# Patient Record
Sex: Female | Born: 1980
Health system: Southern US, Community
[De-identification: ages and names within clinical notes are randomized; demographics above are authoritative.]

## PROBLEM LIST (undated history)

## (undated) DIAGNOSIS — T783XXA Angioneurotic edema, initial encounter: Secondary | ICD-10-CM

## (undated) DIAGNOSIS — G43909 Migraine, unspecified, not intractable, without status migrainosus: Secondary | ICD-10-CM

## (undated) DIAGNOSIS — F419 Anxiety disorder, unspecified: Secondary | ICD-10-CM

## (undated) DIAGNOSIS — L509 Urticaria, unspecified: Secondary | ICD-10-CM

## (undated) DIAGNOSIS — T7840XA Allergy, unspecified, initial encounter: Secondary | ICD-10-CM

## (undated) DIAGNOSIS — L309 Dermatitis, unspecified: Secondary | ICD-10-CM

## (undated) DIAGNOSIS — R42 Dizziness and giddiness: Secondary | ICD-10-CM

## (undated) HISTORY — DX: Anxiety disorder, unspecified: F41.9

## (undated) HISTORY — PX: OTHER SURGICAL HISTORY: SHX169

## (undated) HISTORY — DX: Angioneurotic edema, initial encounter: T78.3XXA

## (undated) HISTORY — DX: Dermatitis, unspecified: L30.9

## (undated) HISTORY — DX: Allergy, unspecified, initial encounter: T78.40XA

## (undated) HISTORY — DX: Dizziness and giddiness: R42

## (undated) HISTORY — DX: Migraine, unspecified, not intractable, without status migrainosus: G43.909

## (undated) HISTORY — DX: Urticaria, unspecified: L50.9

---

## 2020-01-25 DIAGNOSIS — Z0289 Encounter for other administrative examinations: Secondary | ICD-10-CM | POA: Diagnosis not present

## 2020-10-16 ENCOUNTER — Other Ambulatory Visit: Payer: Self-pay

## 2020-10-17 ENCOUNTER — Ambulatory Visit (INDEPENDENT_AMBULATORY_CARE_PROVIDER_SITE_OTHER): Payer: BC Managed Care – PPO | Admitting: Medical

## 2020-10-17 ENCOUNTER — Encounter: Payer: Self-pay | Admitting: Medical

## 2020-10-17 VITALS — BP 132/82 | HR 96 | Temp 98.3°F | Resp 18 | Ht 64.0 in | Wt 110.0 lb

## 2020-10-17 DIAGNOSIS — R5383 Other fatigue: Secondary | ICD-10-CM

## 2020-10-17 DIAGNOSIS — F419 Anxiety disorder, unspecified: Secondary | ICD-10-CM | POA: Diagnosis not present

## 2020-10-17 DIAGNOSIS — Z124 Encounter for screening for malignant neoplasm of cervix: Secondary | ICD-10-CM

## 2020-10-17 DIAGNOSIS — T7840XA Allergy, unspecified, initial encounter: Secondary | ICD-10-CM

## 2020-10-17 DIAGNOSIS — K642 Third degree hemorrhoids: Secondary | ICD-10-CM

## 2020-10-17 DIAGNOSIS — Z0000 Encounter for general adult medical examination without abnormal findings: Secondary | ICD-10-CM | POA: Diagnosis not present

## 2020-10-17 DIAGNOSIS — Z113 Encounter for screening for infections with a predominantly sexual mode of transmission: Secondary | ICD-10-CM

## 2020-10-17 DIAGNOSIS — R768 Other specified abnormal immunological findings in serum: Secondary | ICD-10-CM

## 2020-10-17 DIAGNOSIS — M2550 Pain in unspecified joint: Secondary | ICD-10-CM

## 2020-10-17 LAB — CBC WITH DIFFERENTIAL/PLATELET
Basophils Absolute: 0 10*3/uL (ref 0.0–0.1)
Basophils Relative: 0.6 % (ref 0.0–3.0)
Eosinophils Absolute: 0 10*3/uL (ref 0.0–0.7)
Eosinophils Relative: 0.8 % (ref 0.0–5.0)
HCT: 38.7 % (ref 36.0–46.0)
Hemoglobin: 13.4 g/dL (ref 12.0–15.0)
Lymphocytes Relative: 23.5 % (ref 12.0–46.0)
Lymphs Abs: 1.4 10*3/uL (ref 0.7–4.0)
MCHC: 34.6 g/dL (ref 30.0–36.0)
MCV: 87.5 fl (ref 78.0–100.0)
Monocytes Absolute: 0.4 10*3/uL (ref 0.1–1.0)
Monocytes Relative: 7.4 % (ref 3.0–12.0)
Neutro Abs: 4 10*3/uL (ref 1.4–7.7)
Neutrophils Relative %: 67.7 % (ref 43.0–77.0)
Platelets: 223 10*3/uL (ref 150.0–400.0)
RBC: 4.42 Mil/uL (ref 3.87–5.11)
RDW: 12.7 % (ref 11.5–15.5)
WBC: 5.9 10*3/uL (ref 4.0–10.5)

## 2020-10-17 LAB — COMPREHENSIVE METABOLIC PANEL
ALT: 15 U/L (ref 0–35)
AST: 18 U/L (ref 0–37)
Albumin: 4.7 g/dL (ref 3.5–5.2)
Alkaline Phosphatase: 52 U/L (ref 39–117)
BUN: 12 mg/dL (ref 6–23)
CO2: 26 mEq/L (ref 19–32)
Calcium: 9.6 mg/dL (ref 8.4–10.5)
Chloride: 103 mEq/L (ref 96–112)
Creatinine, Ser: 0.63 mg/dL (ref 0.40–1.20)
GFR: 113.26 mL/min (ref >60.00)
Glucose, Bld: 86 mg/dL (ref 70–99)
Potassium: 3.9 mEq/L (ref 3.5–5.1)
Sodium: 137 mEq/L (ref 135–145)
Total Bilirubin: 0.5 mg/dL (ref 0.2–1.2)
Total Protein: 7.4 g/dL (ref 6.0–8.3)

## 2020-10-17 LAB — LIPID PANEL
Cholesterol: 178 mg/dL (ref 0–200)
HDL: 55.4 mg/dL (ref >39.00)
LDL Cholesterol: 110 mg/dL — ABNORMAL HIGH (ref 0–99)
NonHDL: 123.08
Total CHOL/HDL Ratio: 3
Triglycerides: 65 mg/dL (ref 0.0–149.0)
VLDL: 13 mg/dL (ref 0.0–40.0)

## 2020-10-17 LAB — SEDIMENTATION RATE: Sed Rate: 10 mm/hr (ref 0–20)

## 2020-10-17 LAB — TSH: TSH: 1.51 u[IU]/mL (ref 0.35–4.50)

## 2020-10-17 LAB — C-REACTIVE PROTEIN: CRP: <1 mg/dL (ref 0.5–20.0)

## 2020-10-17 MED ORDER — ALPRAZOLAM ER 0.5 MG PO TB24: ORAL_TABLET | ORAL | 0 refills | Status: DC

## 2020-10-17 NOTE — Patient Instructions (Addendum)
For you wellness exam today I have ordered cbc, cmp, hiv and lipid panel.  Vaccine up to date.  Recommend exercise and healthy diet.  We will let you know lab results as they come in.  Follow up date appointment will be determined after lab review.   For anxiety when flying rx xanax limited number.  For hx of allergic reaction refer to allergist. Use benadryl as you have if reoccurs if severe reaction then ED evaluation.  Refer to gyn for pap.  Refer to gi md for hemorrhoids.  For elevated ana and joint pain inflammatory labs.  For history of abnromal thyroid studies and mild  fatigue  tsh and t4.    Preventive Care 68-38 Years Old, Female Preventive care refers to lifestyle choices and visits with your health care provider that can promote health and wellness. This includes:  A yearly physical exam. This is also called an annual wellness visit.  Regular dental and eye exams.  Immunizations.  Screening for certain conditions.  Healthy lifestyle choices, such as: ? Eating a healthy diet. ? Getting regular exercise. ? Not using drugs or products that contain nicotine and tobacco. ? Limiting alcohol use. What can I expect for my preventive care visit? Physical exam Your health care provider may check your:  Height and weight. These may be used to calculate your BMI (body mass index). BMI is a measurement that tells if you are at a healthy weight.  Heart rate and blood pressure.  Body temperature.  Skin for abnormal spots. Counseling Your health care provider may ask you questions about your:  Past medical problems.  Family's medical history.  Alcohol, tobacco, and drug use.  Emotional well-being.  Home life and relationship well-being.  Sexual activity.  Diet, exercise, and sleep habits.  Work and work Statistician.  Access to firearms.  Method of birth control.  Menstrual cycle.  Pregnancy history. What immunizations do I need? Vaccines are  usually given at various ages, according to a schedule. Your health care provider will recommend vaccines for you based on your age, medical history, and lifestyle or other factors, such as travel or where you work.   What tests do I need? Blood tests  Lipid and cholesterol levels. These may be checked every 5 years starting at age 30.  Hepatitis C test.  Hepatitis B test. Screening  Diabetes screening. This is done by checking your blood sugar (glucose) after you have not eaten for a while (fasting).  STD (sexually transmitted disease) testing, if you are at risk.  BRCA-related cancer screening. This may be done if you have a family history of breast, ovarian, tubal, or peritoneal cancers.  Pelvic exam and Pap test. This may be done every 3 years starting at age 61. Starting at age 26, this may be done every 5 years if you have a Pap test in combination with an HPV test. Talk with your health care provider about your test results, treatment options, and if necessary, the need for more tests.   Follow these instructions at home: Eating and drinking  Eat a healthy diet that includes fresh fruits and vegetables, whole grains, lean protein, and low-fat dairy products.  Take vitamin and mineral supplements as recommended by your health care provider.  Do not drink alcohol if: ? Your health care provider tells you not to drink. ? You are pregnant, may be pregnant, or are planning to become pregnant.  If you drink alcohol: ? Limit how much you have to  0-1 drink a day. ? Be aware of how much alcohol is in your drink. In the [place] one drink equals one 12 oz bottle of beer (355 mL), one 5 oz glass of wine (148 mL), or one 1 oz glass of hard liquor (44 mL).   Lifestyle  Take daily care of your teeth and gums. Brush your teeth every morning and night with fluoride toothpaste. Floss one time each day.  Stay active. Exercise for at least 30 minutes 5 or more days each week.  Do not use any  products that contain nicotine or tobacco, such as cigarettes, e-cigarettes, and chewing tobacco. If you need help quitting, ask your health care provider.  Do not use drugs.  If you are sexually active, practice safe sex. Use a condom or other form of protection to prevent STIs (sexually transmitted infections).  If you do not wish to become pregnant, use a form of birth control. If you plan to become pregnant, see your health care provider for a prepregnancy visit.  Find healthy ways to cope with stress, such as: ? Meditation, yoga, or listening to music. ? Journaling. ? Talking to a trusted person. ? Spending time with friends and family. Safety  Always wear your seat belt while driving or riding in a vehicle.  Do not drive: ? If you have been drinking alcohol. Do not ride with someone who has been drinking. ? When you are tired or distracted. ? While texting.  Wear a helmet and other protective equipment during sports activities.  If you have firearms in your house, make sure you follow all gun safety procedures.  Seek help if you have been physically or sexually abused. What's next?  Go to your health care provider once a year for an annual wellness visit.  Ask your health care provider how often you should have your eyes and teeth checked.  Stay up to date on all vaccines. This information is not intended to replace advice given to you by your health care provider. Make sure you discuss any questions you have with your health care provider. Document Revised: 01/16/2020 Document Reviewed: 01/29/2018 Elsevier Patient Education  2021 [employer].

## 2020-10-17 NOTE — Progress Notes (Signed)
Subjective:    Patient ID: Kelly Houston, female    DOB: [birth year], 40 y.o.   MRN: [id number]  HPI  Pt in for first time.  Pt grew up in [place] and [place]. Pt works as Art gallery manager at [employer]. Pt exercises 3-4 times a week(walks, runs and weights). Pt states eats healthy. Non smoker. Social sporadic alcohol use.  Pt last pap about 3 years. Was normal.    Pt had j and j vaccine then phizer booster.  No chronic med use.  Pt does have some anxiety when she travels by air. Upcoming trip to [place] and then will fly to [place].  Mild seasonal allergies in spring. Random rash on body that occurs once a year with lip swelling. No obvious wheezing or shortness of breath. No use of ace inhibitors. Has occurred yearly since youth/teenager. When she gets this will use benadryl and will calm down quickly. No pattern/cause noted on review.   Hx of elevated ANA in past. Some joint pains.       Review of Systems  Constitutional: Positive for fatigue. Negative for chills and fever.  Respiratory: Negative for chest tightness, shortness of breath and wheezing.   Cardiovascular: Negative for chest pain and palpitations.  Gastrointestinal: Negative for abdominal pain.  Musculoskeletal: Positive for arthralgias.       Pain in hands with repetitive movments.  Hematological: Negative for adenopathy. Does not bruise/bleed easily.  Psychiatric/Behavioral: Negative for behavioral problems and confusion.    Past Medical History:  Diagnosis Date  . Allergy   . Anxiety    when she travels by air.     Social History   Socioeconomic History  . Marital status: Single    Spouse name: Not on file  . Number of children: Not on file  . Years of education: Not on file  . Highest education level: Not on file  Occupational History  . Not on file  Tobacco Use  . Smoking status: Not on file  . Smokeless tobacco: Not on file  Substance and Sexual Activity  . Alcohol use: Not on  file  . Drug use: Not on file  . Sexual activity: Not on file  Other Topics Concern  . Not on file  Social History Narrative  . Not on file   Social Determinants of Health   Financial Resource Strain: Not on file  Food Insecurity: Not on file  Transportation Needs: Not on file  Physical Activity: Not on file  Stress: Not on file  Social Connections: Not on file  Intimate Partner Violence: Not on file     No family history on file.  Not on File  Current Outpatient Medications on File Prior to Visit  Medication Sig Dispense Refill  . fluticasone (FLONASE) 50 MCG/ACT nasal spray Place into both nostrils daily.    . naproxen sodium (ALEVE) 220 MG tablet Take 220 mg by mouth.     No current facility-administered medications on file prior to visit.    BP 132/82   Pulse 96   Temp 98.3 F (36.8 C)   Resp 18   Ht 5\' 4"  (1.626 m)   Wt 110 lb (49.9 kg)   LMP 09/29/2020   SpO2 100%   BMI 18.88 kg/m      Objective:   Physical Exam  General Mental Status- Alert. General Appearance- Not in acute distress.   Skin General: Color- Normal Color. Moisture- Normal Moisture.  Neck Carotid Arteries- Normal color. Moisture- Normal Moisture. No  carotid bruits. No JVD.  Chest and Lung Exam Auscultation: Breath Sounds:-Normal.  Cardiovascular Auscultation:Rythm- Regular. Murmurs & Other Heart Sounds:Auscultation of the heart reveals- No Murmurs.  Abdomen Inspection:-Inspeection Normal. Palpation/Percussion:Note:No mass. Palpation and Percussion of the abdomen reveal- Non Tender, Non Distended + BS, no rebound or guarding.    Neurologic Cranial Nerve exam:- CN III-XII intact(No nystagmus), symmetric smile. Strength:- 5/5 equal and symmetric strength both upper and lower extremities.      Assessment & Plan:  For you wellness exam today I have ordered cbc, cmp, hiv and lipid panel.  Vaccine up to date.  Recommend exercise and healthy diet.  We will let you know  lab results as they come in.  Follow up date appointment will be determined after lab review.   For anxiety when flying rx xanax limited number.  For hx of allergic reaction refer to allergist. Use benadryl as you have if reoccurs if severe reaction then ED evaluation.  Refer to gyn for pap.  Refer to gi md for hemorrhoids.  For elevated ana and joint pain inflammatory labs.  For history of abnromal thyroid studies and mild  fatigue  tsh and t4.

## 2020-10-19 ENCOUNTER — Telehealth: Payer: Self-pay | Admitting: Medical

## 2020-10-19 DIAGNOSIS — R768 Other specified abnormal immunological findings in serum: Secondary | ICD-10-CM

## 2020-10-19 DIAGNOSIS — M2550 Pain in unspecified joint: Secondary | ICD-10-CM

## 2020-10-19 LAB — RHEUMATOID FACTOR: Rheumatoid fact SerPl-aCnc: <14 IU/mL (ref <14)

## 2020-10-19 LAB — HIV ANTIBODY (ROUTINE TESTING W REFLEX): HIV 1&2 Ab, 4th Generation: NONREACTIVE

## 2020-10-19 LAB — ANA: Anti Nuclear Antibody (ANA): POSITIVE — AB

## 2020-10-19 LAB — ANTI-NUCLEAR AB-TITER (ANA TITER): ANA Titer 1: 1:80 {titer} — ABNORMAL HIGH

## 2020-10-19 NOTE — Telephone Encounter (Signed)
  Referral to rheumatologist placed. 

## 2020-11-03 ENCOUNTER — Encounter: Payer: Self-pay | Admitting: Certified Nurse Midwife

## 2020-11-03 ENCOUNTER — Other Ambulatory Visit: Payer: Self-pay

## 2020-11-03 ENCOUNTER — Ambulatory Visit (INDEPENDENT_AMBULATORY_CARE_PROVIDER_SITE_OTHER): Payer: BC Managed Care – PPO | Admitting: Certified Nurse Midwife

## 2020-11-03 ENCOUNTER — Other Ambulatory Visit (HOSPITAL_COMMUNITY)
Admission: RE | Admit: 2020-11-03 | Discharge: 2020-11-03 | Disposition: A | Discharge status: Home / Self Care | Payer: BC Managed Care – PPO | Source: Ambulatory Visit | Attending: Certified Nurse Midwife | Admitting: Certified Nurse Midwife

## 2020-11-03 VITALS — BP 122/68 | HR 72 | Resp 16 | Ht 64.0 in | Wt 112.0 lb

## 2020-11-03 DIAGNOSIS — Z01419 Encounter for gynecological examination (general) (routine) without abnormal findings: Secondary | ICD-10-CM | POA: Diagnosis not present

## 2020-11-03 NOTE — Progress Notes (Signed)
Gynecology Annual Exam   History of Present Illness: Kelly Houston is a 40 y.o. single virginal female presenting for an annual exam. She has no complaints today. She is not sexually active. She does perform self breast exams. There is no notable family history of breast or ovarian cancer in her family.   Past Medical History:  Past Medical History:  Diagnosis Date  . Allergy   . Anxiety    when she travels by air.  . Migraines    since age 26    Past Surgical History:  Past Surgical History:  Procedure Laterality Date  . rt wrist cyst removal. ganglion.      Gynecologic History:  LMP: Patient's last menstrual period was 10/25/2020. Average Interval: regular 23-35 days since Covid vaccine Heavy Menses: no Clots: yes Intermenstrual Bleeding: no Postcoital Bleeding: not applicable Dysmenorrhea: minimal Contraception: n/a Last Pap: completed ~3 yrs ago and normal; no hx abnormal paps  Mammogram: reports hx of diag mammo for cysts  Obstetric History: [id number]  Family History:  Family History  Problem Relation Age of Onset  . High Cholesterol Father   . Hypertension Mother     Social History:  Social History   Socioeconomic History  . Marital status: Single    Spouse name: Not on file  . Number of children: Not on file  . Years of education: Not on file  . Highest education level: Not on file  Occupational History  . Not on file  Tobacco Use  . Smoking status: Never Smoker  . Smokeless tobacco: Never Used  Vaping Use  . Vaping Use: Never used  Substance and Sexual Activity  . Alcohol use: Yes    Comment: socially  . Drug use: Never  . Sexual activity: Never    Birth control/protection: None  Other Topics Concern  . Not on file  Social History Narrative  . Not on file   Social Determinants of Health   Financial Resource Strain: Not on file  Food Insecurity: Not on file  Transportation Needs: Not on file  Physical Activity: Not on file   Stress: Not on file  Social Connections: Not on file  Intimate Partner Violence: Not on file    Allergies:  Allergies  Allergen Reactions  . Propranolol Hives and Rash    Medications: Prior to Admission medications   Medication Sig Start Date End Date Taking? Authorizing Provider  ALPRAZolam (XANAX XR) 0.5 MG 24 hr tablet 1 tab po q day prn anxiety 10/17/20  Yes Saguier, Ramon Dredge, PA-C  fluticasone [facility]) 50 MCG/ACT nasal spray Place into both nostrils daily.   Yes [provider]  naproxen sodium (ALEVE) 220 MG tablet Take 220 mg by mouth.   Yes [provider]    Review of Systems: negative except noted in HPI  Physical Exam Vitals: BP 122/68   Pulse 72   Resp 16   Ht 5\' 4"  (1.626 m)   Wt 112 lb (50.8 kg)   LMP 10/25/2020   BMI 19.22 kg/m  General: NAD HEENT: normocephalic, atraumatic Thyroid: no enlargement, no palpable nodules Pulmonary: Normal rate and effort, CTAB Cardiovascular: RRR Breast: Breast symmetrical, no tenderness, no palpable nodules or masses, no skin or nipple retraction present, no nipple discharge. No axillary or supraclavicular lymphadenopathy. Abdomen: soft, non-tender, non-distended. No hepatomegaly, splenomegaly or masses palpable. No evidence of hernia  Genitourinary:  External: Normal external female genitalia. Normal urethral meatus  Vagina: Normal vaginal mucosa, no evidence of prolapse   Cervix:  Grossly normal in appearance, no bleeding, nulliparous  Uterus: deferred  Adnexa: deferred  Rectal: deferred Extremities: no edema, erythema, or tenderness Neurologic: Grossly intact Psychiatric: mood appropriate, affect full  Female chaperone present for pelvic and breast portions of the physical exam  Assessment:  1. Well woman exam with routine gynecological exam     Plan: Papsmear today Follow up with GYN in 1 year or prn Follow up with PCP as planned  Donette Larry, CNM 11/03/2020 9:59 AM

## 2020-11-06 LAB — CYTOLOGY - PAP
Comment: NEGATIVE
Diagnosis: NEGATIVE
High risk HPV: NEGATIVE

## 2020-11-12 NOTE — Progress Notes (Signed)
Office Visit Note  Patient: Kelly Houston             Date of Birth: April 27, 1981           MRN: [id number]             PCP: Elise Benne Referring: Elise Benne Visit Date: 11/13/2020  Subjective:  New Patient (Initial Visit) (Patient complains of fatigue, upper extremity numbness (left>right), bilateral hand pain and stiffness. )   History of Present Illness: Kelly Houston is a 40 y.o. female here for evaluation of positive ANA and joint pains. She has experienced some degree of symptoms for years. More recently she describes increases in numbness episodically affecting the left worse than right arms and in the left foot and great toe. She notices hand pain and stiffness increased with cold exposure. Sleeping on either side often provokes waking overnight or early morning with numbness down the left arm. She has some pain and tightness in the low back limiting hip range of movement. She has had some previous autoimmune workup apparently, she reports what sounds like positive autoantibody tests in the past. She also reports a recent tick bite over the past weekend. She was hiking outdoors on Saturday and noticed a tick attached to her left flank Sunday. She did shower after outdoors exposure Saturday but does not recall any insect at that time. Currently small erythematous rash present without any associated pain or sensation.  Labs reviewed 10/2020 ANA 1:80 speckled TSH 1.51 RF neg ESR 10 CRP neg CBC wnl CMP wnl   Activities of Daily Living:  Patient reports morning stiffness for 15-20 minutes.   Patient Reports nocturnal pain.  Difficulty dressing/grooming: Denies Difficulty climbing stairs: Denies Difficulty getting out of chair: Denies Difficulty using hands for taps, buttons, cutlery, and/or writing: Reports  Review of Systems  Constitutional:  Positive for fatigue.  HENT:  Positive for mouth sores, mouth dryness and nose dryness.    Eyes:  Positive for itching, visual disturbance and dryness. Negative for pain.  Respiratory:  Negative for cough, hemoptysis, shortness of breath and difficulty breathing.   Cardiovascular:  Negative for chest pain, palpitations and swelling in legs/feet.  Gastrointestinal:  Positive for abdominal pain. Negative for blood in stool, constipation and diarrhea.  Endocrine: Negative for increased urination.  Genitourinary:  Negative for painful urination.  Musculoskeletal:  Positive for joint pain, joint pain and morning stiffness. Negative for joint swelling, myalgias, muscle weakness, muscle tenderness and myalgias.  Skin:  Positive for color change and rash. Negative for redness.  Allergic/Immunologic: Negative for susceptible to infections.  Neurological:  Positive for dizziness, numbness and headaches. Negative for memory loss and weakness.  Hematological:  Negative for swollen glands.  Psychiatric/Behavioral:  Positive for sleep disturbance. Negative for confusion.    PMFS History:  Patient Active Problem List   Diagnosis Date Noted   Tick bite of abdomen 11/13/2020   Positive ANA (antinuclear antibody) 11/13/2020   Migraines 10/31/1989     Past Medical History:  Diagnosis Date   Allergy    Anxiety    when she travels by air.   Migraines    since age 16   Vertigo    Per patient    Family History  Problem Relation Age of Onset   Hypertension Mother    High Cholesterol Father    Depression Brother    Thyroid disease Brother    Lupus Maternal Grandmother    Past Surgical History:  Procedure  Laterality Date   rt wrist cyst removal. ganglion.     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     Left Ear: External ear normal.     Mouth/Throat:     Mouth: Mucous membranes are moist.     Pharynx: Oropharynx is clear.  Eyes:     Conjunctiva/sclera: Conjunctivae normal.  Cardiovascular:     Rate and Rhythm: Normal rate and regular rhythm.  Pulmonary:     Effort: Pulmonary effort is normal.     Breath sounds: Normal breath sounds.  Skin:    General: Skin is warm and dry.     Comments: ~1cm diameter, flat erythematous patch on left flank  Neurological:     General: No focal deficit present.     Mental Status: She is alert.     Deep Tendon Reflexes: Reflexes normal.  Psychiatric:        Mood and Affect: Mood normal.     Musculoskeletal Exam: Neck full ROM no tenderness Shoulders full ROM no tenderness or swelling Elbows full ROM no tenderness or swelling Wrists full ROM no tenderness or swelling Fingers full ROM no tenderness or swelling Tenderness to palpation over lumbosacral paraspinal muscles, lateral hip pain with Pace and FABER maneuvers Hip normal internal and external rotation without pain, no tenderness to lateral hip palpation Knees full ROM no tenderness or swelling Ankles full ROM no tenderness or swelling  Investigation: No additional findings.  Imaging: No results found.  Recent Labs: Lab Results  Component Value Date   WBC 5.9 10/17/2020   HGB 13.4 10/17/2020   PLT 223.0 10/17/2020   NA 137 10/17/2020   K 3.9 10/17/2020   CL 103 10/17/2020   CO2 26 10/17/2020   GLUCOSE 86 10/17/2020   BUN 12 10/17/2020   CREATININE 0.63 10/17/2020   BILITOT 0.5 10/17/2020   ALKPHOS 52 10/17/2020   AST 18 10/17/2020   ALT 15 10/17/2020   PROT 7.4 10/17/2020   ALBUMIN 4.7 10/17/2020   CALCIUM 9.6 10/17/2020    Speciality Comments: No specialty comments  available.  Procedures:  No procedures performed Allergies: Propranolol   Assessment / Plan:     Visit Diagnoses: Positive ANA (antinuclear antibody) - Plan: Anti-scleroderma antibody, RNP Antibody, Anti-Smith antibody, Sjogrens syndrome-A extractable nuclear antibody, Sjogrens syndrome-B extractable nuclear antibody, Anti-DNA antibody, double-stranded, C3 and C4  Positive ANA antibodies although no specific clinical criteria for SLE or related CTD on exam today. ANA low positive will check specific ENA titers. Lower pretest suspicion of an active inflammatory process but we can recheck. Ongoing paresthesias or numbness also could suggest neurologic symptom component.  Tick bite of abdomen, initial encounter  Tick bite sounds like not likely attached more than a day and not bitten from an endemic area. I do not recommend prophylactic antibiotic.  Orders: Orders Placed This Encounter  Procedures   Anti-scleroderma antibody   RNP Antibody   Anti-Smith antibody  Sjogrens syndrome-A extractable nuclear antibody   Sjogrens syndrome-B extractable nuclear antibody   Anti-DNA antibody, double-stranded   C3 and C4    No orders of the defined types were placed in this encounter.   Follow-Up Instructions: Return in about 2 weeks (around 11/27/2020).   Collier Salina, MD  Note - This record has been created using [employer].  Chart creation errors have been sought, but may not always  have been located. Such creation errors do not reflect on  the standard of medical care.

## 2020-11-13 ENCOUNTER — Ambulatory Visit: Payer: BC Managed Care – PPO | Admitting: Internal Medicine

## 2020-11-13 ENCOUNTER — Other Ambulatory Visit: Payer: Self-pay

## 2020-11-13 ENCOUNTER — Encounter: Payer: Self-pay | Admitting: Internal Medicine

## 2020-11-13 VITALS — BP 149/78 | HR 73 | Ht 64.0 in | Wt 114.0 lb

## 2020-11-13 DIAGNOSIS — R768 Other specified abnormal immunological findings in serum: Secondary | ICD-10-CM | POA: Diagnosis not present

## 2020-11-13 DIAGNOSIS — W57XXXA Bitten or stung by nonvenomous insect and other nonvenomous arthropods, initial encounter: Secondary | ICD-10-CM | POA: Insufficient documentation

## 2020-11-13 DIAGNOSIS — S30861A Insect bite (nonvenomous) of abdominal wall, initial encounter: Secondary | ICD-10-CM

## 2020-11-13 NOTE — Patient Instructions (Signed)
Checking more specific antibody tests to follow up positive ANA and generalized symptoms  Anti-DNA Antibody Test Why am I having this test? The anti-DNA antibody test helps with the diagnosis and follow-up of systemic lupus erythematosus (SLE). It is also used to monitor treatment of thiscondition as the antibody decreases with successful therapy. What is being tested? This test measures the amount of anti-DNA antibody in the blood. This antibody is found in 65-80% of patients with active SLE. This antibody is not as commonin patients who have other diseases. What kind of sample is taken?  A blood sample is required for this test. It is usually collected by insertinga needle into a blood vessel. How are the results reported? Your test results will be reported as a value. Your test results may also be reported as positive, intermediate, or negative. Your health care provider will compare your results to normal ranges that were established after testing a large group of people (reference values). Reference values may vary among labs and hospitals. For this test, common reference values are: Positive: 10 or more international units/mL. Intermediate: 5-9 international units/mL. Negative: Less than 5 international units/mL. What do the results mean? Positive results, which are associated with results that are higher than the reference values, may indicate: Autoimmune disorders such as SLE. Infectious mononucleosis. Chronic liver conditions. Intermediate results mean that the anti-DNA antibody levels are higher thannormal, but not high enough to be considered positive. Negative results mean that you do not have the anti-DNA antibody that isassociated with these conditions. Talk with your health care provider about what your results mean. Questions to ask your health care provider Ask your health care provider, or the department that is doing the test: When will my results be ready? How will I get  my results? What are my treatment options? What other tests do I need? What are my next steps? Summary The anti-DNA antibody test helps with the diagnosis and follow-up of systemic lupus erythematosus (SLE). It is also used to monitor treatment of this condition as the antibody decreases with successful therapy. This test measures the amount of anti-DNA antibody in the blood. Elevated levels of anti-DNA antibody can be seen in patients with SLE and certain other conditions.  Complement Assay Test Why am I having this test? Complement refers to a group of proteins that are part of the body's disease-fighting system (immune system). A complement assay test provides information about some or all of these proteins. You may have this test: To diagnose a lack, or deficiency, of certain complement proteins. Deficiencies can be passed from parent to child (inherited). To monitor an infection or autoimmune disease. If you have unexplained inflammation or swelling (edema). If you have bacterial infections again and again. What is being tested? This test can be used to measure: Total complement. This is the total number of protein complements in your blood. The number of each kind of complement in your blood. The nine main kinds of complement are labeled C1 through C9. Some of these complements, such as C3 and C4, are especially important and have many functions in the body. Depending on why you are having the test, your health care provider may test your total complement or only some individual complements, such as C3 and C4. The total complement assay test may be done before individual complements aretested. What kind of sample is taken?  A blood sample is required for this test. It is usually collected by insertinga needle into a blood vessel. Tell  a health care provider about: Any allergies you have. All medicines you are taking, including vitamins, herbs, eye drops, creams, and over-the-counter  medicines. Any blood disorders you have. Any surgeries you have had. Any medical conditions you have. Whether you are pregnant or may be pregnant. How are the results reported? Your results will be reported as a value that tells you how much complement is in your blood. This will be given as units per milliliter of blood (units/mL) or as milligrams per deciliter of blood (mg/dL). Your results may be reportedas total complement, or as individual complements, or both. Your health care provider will compare your results to normal ranges that were established after testing a large group of people (reference ranges). Reference ranges may vary among labs and hospitals. For this test, reference ranges for some of the most commonly measured complement assays may be: Total complement: 30-75 units/mL. C2: 1-4 mg/dL. C3: 75-175 mg/dL. C4: 22-45 units/mL. What do the results mean? Results within reference ranges are considered normal, which means you have anormal amount of complement in your blood. Results that are higher than the reference ranges may be caused by: Inflammatory disease. Heart attack. Cancer. Complement deficiencies, or results lower than the reference ranges, may be caused by: Certain inherited conditions. Autoimmune disease. Certain liver diseases. Malnutrition. Certain types of anemia that result in breakdown of red blood cells (hemolytic anemia). Talk with your health care provider about what your results mean. Questions to ask your health care provider Ask your health care provider, or the department that is doing the test: When will my results be ready? How will I get my results? What are my treatment options? What other tests do I need? What are my next steps? Summary Complement refers to a group of proteins that are part of the body's disease-fighting system (immune system). A complement assay test can provide information about some or all of these proteins. You may have  a complement assay test to help diagnose a complement deficiency, and to monitor some infections or autoimmune disease. Talk with your health care provider about what your results mean. This information is not intended to replace advice given to you by your health care provider. Make sure you discuss any questions you have with your healthcare provider. Document Revised: 03/16/2020 Document Reviewed: 03/16/2020 Elsevier Patient Education  2022 [employer].

## 2020-11-14 LAB — SJOGRENS SYNDROME-A EXTRACTABLE NUCLEAR ANTIBODY: SSA (Ro) (ENA) Antibody, IgG: <1 AI

## 2020-11-14 LAB — ANTI-SMITH ANTIBODY: ENA SM Ab Ser-aCnc: <1 AI

## 2020-11-14 LAB — RNP ANTIBODY: Ribonucleic Protein(ENA) Antibody, IgG: <1 AI

## 2020-11-14 LAB — SJOGRENS SYNDROME-B EXTRACTABLE NUCLEAR ANTIBODY: SSB (La) (ENA) Antibody, IgG: <1 AI

## 2020-11-14 LAB — C3 AND C4
C3 Complement: 109 mg/dL (ref 83–193)
C4 Complement: 21 mg/dL (ref 15–57)

## 2020-11-14 LAB — ANTI-SCLERODERMA ANTIBODY: Scleroderma (Scl-70) (ENA) Antibody, IgG: 1.1 AI — AB

## 2020-11-14 LAB — ANTI-DNA ANTIBODY, DOUBLE-STRANDED: ds DNA Ab: <1 IU/mL

## 2020-11-20 NOTE — Progress Notes (Signed)
Lab results are mostly negative, one test is positive at a very low level that I suspect is not clinically important.

## 2020-12-06 ENCOUNTER — Ambulatory Visit: Payer: BC Managed Care – PPO | Admitting: Internal Medicine

## 2020-12-06 ENCOUNTER — Other Ambulatory Visit: Payer: Self-pay

## 2020-12-06 ENCOUNTER — Encounter: Payer: Self-pay | Admitting: Internal Medicine

## 2020-12-06 VITALS — BP 122/73 | HR 71 | Ht 64.0 in | Wt 113.6 lb

## 2020-12-06 DIAGNOSIS — I7300 Raynaud's syndrome without gangrene: Secondary | ICD-10-CM | POA: Diagnosis not present

## 2020-12-06 DIAGNOSIS — R768 Other specified abnormal immunological findings in serum: Secondary | ICD-10-CM | POA: Diagnosis not present

## 2020-12-06 NOTE — Progress Notes (Signed)
Office Visit Note  Patient: Kelly Houston             Date of Birth: 01-Aug-1980           MRN: [id number]             PCP: Elise Benne Referring: Elise Benne Visit Date: 12/06/2020   Subjective:   History of Present Illness: Kelly Houston is a 40 y.o. female here for follow up for positive ANA and chronic joint pains. Lab testing for the positive ANA at intiial visit was mostly negative, Scl-70 Ab was very low positive at 1.1. She also had a recent tick bite on the left side of her torso that did not look concerning with probable short attachment time and not an endemic region.  Since last visit she continues to have the same symptoms with episodic discoloration in fingers and toes numbness and paresthesia in her arms and hands bilaterally and positional tremor especially on the right hand.  The tick bite rash on her left flank resolved completely.   Previous HPI 11/13/20 Kelly Houston is a 40 y.o. female here for evaluation of positive ANA and joint pains. She has experienced some degree of symptoms for years. More recently she describes increases in numbness episodically affecting the left worse than right arms and in the left foot and great toe. She notices hand pain and stiffness increased with cold exposure. Sleeping on either side often provokes waking overnight or early morning with numbness down the left arm. She has some pain and tightness in the low back limiting hip range of movement. She has had some previous autoimmune workup apparently, she reports what sounds like positive autoantibody tests in the past. She also reports a recent tick bite over the past weekend. She was hiking outdoors on Saturday and noticed a tick attached to her left flank Sunday. She did shower after outdoors exposure Saturday but does not recall any insect at that time. Currently small erythematous rash present without any associated pain or sensation.    Labs reviewed 10/2020 ANA 1:80 speckled TSH 1.51 RF neg ESR 10 CRP neg CBC wnl CMP wnl  Review of Systems  Constitutional:  Positive for fatigue.  HENT:  Negative for mouth sores, mouth dryness and nose dryness.   Eyes:  Positive for itching and dryness. Negative for pain.  Respiratory:  Negative for shortness of breath and difficulty breathing.   Cardiovascular:  Negative for chest pain and palpitations.  Gastrointestinal:  Positive for constipation and diarrhea. Negative for blood in stool.  Endocrine: Negative for increased urination.  Genitourinary:  Negative for difficulty urinating.  Musculoskeletal:  Positive for joint pain and joint pain. Negative for joint swelling, myalgias, morning stiffness, muscle tenderness and myalgias.  Skin:  Negative for color change, rash and redness.  Allergic/Immunologic: Negative for susceptible to infections.  Neurological:  Positive for numbness. Negative for dizziness, headaches, memory loss and weakness.  Hematological:  Positive for bruising/bleeding tendency.  Psychiatric/Behavioral:  Negative for confusion.    PMFS History:  Patient Active Problem List   Diagnosis Date Noted   Raynaud's syndrome without gangrene 12/10/2020   Positive ANA (antinuclear antibody) 11/13/2020   Migraines 10/31/1989    Past Medical History:  Diagnosis Date   Allergy    Anxiety    when she travels by air.   Migraines    since age 54   Vertigo    Per patient    Family History  Problem  Relation Age of Onset   Hypertension Mother    High Cholesterol Father    Depression Brother    Thyroid disease Brother    Lupus Maternal Grandmother    Past Surgical History:  Procedure Laterality Date   rt wrist cyst removal. ganglion.     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  Pulmonary:     Effort: Pulmonary effort is normal.     Breath sounds: Normal breath sounds.  Skin:    General: Skin is warm and dry.     Comments: Few telangiectasias visible on face and bilateral forearms Nailfold capillaroscopy with some normal and some tortuous vessels there is no nail or digital pitting  Neurological:     Mental Status: She is alert.     Musculoskeletal Exam:  Shoulders full ROM no tenderness or swelling Elbows full ROM no tenderness or swelling Wrists full ROM no tenderness or swelling Fingers full ROM no tenderness or swelling Knees full ROM no tenderness or swelling Ankles full ROM no tenderness or swelling  Investigation: No additional findings.  Imaging: No results found.  Recent Labs: Lab Results  Component Value Date   WBC 5.9 10/17/2020   HGB 13.4 10/17/2020   PLT 223.0 10/17/2020   NA 137 10/17/2020   K 3.9 10/17/2020   CL 103 10/17/2020   CO2 26 10/17/2020   GLUCOSE 86 10/17/2020   BUN 12 10/17/2020   CREATININE 0.63 10/17/2020   BILITOT 0.5 10/17/2020   ALKPHOS 52 10/17/2020   AST 18 10/17/2020   ALT 15 10/17/2020   PROT 7.4 10/17/2020   ALBUMIN 4.7 10/17/2020   CALCIUM 9.6 10/17/2020    Speciality Comments: No specialty comments available.  Procedures:  No procedures performed Allergies: Propranolol   Assessment / Plan:     Visit Diagnoses: Positive ANA (antinuclear antibody) Raynaud's syndrome without gangrene  We discussed her findings persistent low positive antibodies with her Raynaud's telangiectasias generalized symptoms suspicious for some type of inflammation or vasculopathy but would not diagnose as systemic  sclerosis and I do not see a clear indication needing treatment for connective tissue disease at this time.  I recommend we can follow-up in about 6 months to reassess symptoms also recheck her Raynaud's symptoms typically are more prominent in cold weather.  Orders: No orders of the defined types were placed in this encounter.  No orders of the defined types were placed in this encounter.    Follow-Up Instructions: Return in about 6 months (around 06/12/2021) for +ANA+SCL raynaud's tremor obs f/u [id number]   ChCollier SalinaMD  Note - This record has been created using [employer] Chart creation errors have been sought, but may not always  have been located. Such creation errors do not reflect on  the standard of medical care.

## 2020-12-08 ENCOUNTER — Emergency Department
Admission: EM | Admit: 2020-12-08 | Discharge: 2020-12-08 | Disposition: A | Discharge status: Home / Self Care | Payer: BC Managed Care – PPO | Source: Home / Self Care | Attending: Family Medicine | Admitting: Family Medicine

## 2020-12-08 ENCOUNTER — Encounter: Payer: Self-pay | Admitting: Medical

## 2020-12-08 ENCOUNTER — Other Ambulatory Visit: Payer: Self-pay

## 2020-12-08 ENCOUNTER — Encounter: Payer: Self-pay | Admitting: Emergency Medicine

## 2020-12-08 DIAGNOSIS — J029 Acute pharyngitis, unspecified: Secondary | ICD-10-CM

## 2020-12-08 LAB — POCT RAPID STREP A (OFFICE): Rapid Strep A Screen: NEGATIVE

## 2020-12-08 NOTE — ED Provider Notes (Signed)
[facility]    CSN: [id number] Arrival date & time: 12/08/20  0901      History   Chief Complaint Chief Complaint  Patient presents with   Sore Throat    HPI Kelly Houston is a 40 y.o. female.   Four days ago patient developed a sore throat that has persisted. She feels fatigued but denies fever and other symptoms.  She has had two negative home COVID19 tests.  The history is provided by the patient.  Diagnosis Date   Allergy    Anxiety    when she travels by air.   Migraines    since age 54   Vertigo    Per patient    Patient Active Problem List   Diagnosis Date Noted   Tick bite of abdomen 11/13/2020   Positive ANA (antinuclear antibody) 11/13/2020   Migraines 10/31/1989    Past Surgical History:  Procedure Laterality Date   rt wrist cyst removal. ganglion.      OB History     Gravida  0   Para  0   Term  0   Preterm  0   AB  0   Living  0      SAB  0   IAB  0   Ectopic  0   Multiple  0   Live Births  0            Home Medications    Prior to Admission medications   Medication Sig Start Date End Date Taking? Authorizing Provider  ALPRAZolam (XANAX XR) 0.5 MG 24 hr tablet 1 tab po q day prn anxiety 10/17/20  Yes Saguier, Ramon Dredge, PA-C  fluticasone [facility]) 50 MCG/ACT nasal spray Place into both nostrils as needed.   Yes [provider]  naproxen sodium (ALEVE) 220 MG tablet Take 220 mg by mouth as needed.   Yes [provider]    Family History Family History  Problem Relation Age of Onset   Hypertension Mother    High Cholesterol Father    Depression Brother    Thyroid disease Brother    Lupus Maternal Grandmother     Social History Social History   Tobacco Use   Smoking status: Never   Smokeless tobacco: Never  Vaping Use   Vaping Use: Never used  Substance Use Topics   Alcohol use: Yes    Comment: socially   Drug use: Never     Allergies   Propranolol   Review  of Systems Review of Systems + sore throat No cough No pleuritic pain No wheezing No nasal congestion No post-nasal drainage No sinus pain/pressure No itchy/red eyes No earache No hemoptysis No SOB No fever/chills No nausea No vomiting No abdominal pain No diarrhea No urinary symptoms No skin rash + fatigue No myalgias No headache   Physical Exam Triage Vital Signs ED Triage Vitals  Enc Vitals Group     BP 12/08/20 1034 128/82     Pulse Rate 12/08/20 1034 69     Resp --      Temp 12/08/20 1034 99.3 F (37.4 C)     Temp Source 12/08/20 1034 Oral     SpO2 12/08/20 1034 100 %     Weight 12/08/20 1035 110 lb (49.9 kg)     Height 12/08/20 1035 5\' 4"  (1.626 m)     Head Circumference --      Peak Flow --      Pain Score 12/08/20 1035 8  Pain Loc --      Pain Edu? --      Excl. in GC? --    No data found.  Updated Vital Signs BP 128/82 (BP Location: Right Arm)   Pulse 69   Temp 99.3 F (37.4 C) (Oral)   Ht 5\' 4"  (1.626 m)   Wt 49.9 kg   SpO2 100%   BMI 18.88 kg/m   Visual Acuity Right Eye Distance:   Left Eye Distance:   Bilateral Distance:    Right Eye Near:   Left Eye Near:    Bilateral Near:     Physical Exam Nursing notes and Vital Signs reviewed. Appearance:  Patient appears stated age, and in no acute distress Eyes:  Pupils are equal, round, and reactive to light and accomodation.  Extraocular movement is intact.  Conjunctivae are not inflamed  Ears:  Canals normal.  Tympanic membranes normal.  Nose:  Mildly congested turbinates.  No sinus tenderness.   Pharynx:  Erythematous posteriorly. Neck:  Supple.  Mildly enlarged lateral nodes are present, tender to palpation on the left.  Tonsillar nodes are mildly enlarged and tender to palpation. Lungs:  Clear to auscultation.  Breath sounds are equal.  Moving air well. Heart:  Regular rate and rhythm without murmurs, rubs, or gallops.  Abdomen:  Nontender without masses or hepatosplenomegaly.   Bowel sounds are present.  No CVA or flank tenderness.  Extremities:  No edema.  Skin:  No rash present.   UC Treatments / Results  Labs (all labs ordered are listed, but only abnormal results are displayed) Labs Reviewed  CULTURE, GROUP A STREP  NOVEL CORONAVIRUS, NAA  POCT RAPID STREP A (OFFICE) negative    EKG   Radiology No results found.  Procedures Procedures (including critical care time)  Medications Ordered in UC Medications - No data to display  Initial Impression / Assessment and Plan / UC Course  I have reviewed the triage vital signs and the nursing notes.  Pertinent labs & imaging results that were available during my care of the patient were reviewed by me and considered in my medical decision making (see chart for details).    CENTOR 3.  Strep A DNA probe pending.  COVID19 PCA pending. Followup with Family Doctor if not improved in one week.   Final Clinical Impressions(s) / UC Diagnoses   Final diagnoses:  Sore throat  Acute pharyngitis, unspecified etiology     Discharge Instructions      Try warm salt water gargles for sore throat.  May take Aleve, 2 tabs every 12 hours with food.   ED Prescriptions   None       , MD 12/12/20 1335

## 2020-12-08 NOTE — ED Triage Notes (Signed)
Patient c/o sore throat since Tuesday, fatigued, no other sx's.  Patient has been taking Aleve for the pain.  Negative home COVID test x 2.  Patient is vaccinated for COVID.

## 2020-12-08 NOTE — Discharge Instructions (Signed)
Try warm salt water gargles for sore throat.  May take Aleve, 2 tabs every 12 hours with food.

## 2020-12-09 LAB — NOVEL CORONAVIRUS, NAA: SARS-CoV-2, NAA: NOT DETECTED

## 2020-12-09 LAB — SARS-COV-2, NAA 2 DAY TAT

## 2020-12-10 DIAGNOSIS — I7300 Raynaud's syndrome without gangrene: Secondary | ICD-10-CM | POA: Insufficient documentation

## 2020-12-11 ENCOUNTER — Ambulatory Visit: Payer: BC Managed Care – PPO | Admitting: Internal Medicine

## 2020-12-13 LAB — CULTURE, GROUP A STREP: Strep A Culture: NEGATIVE

## 2020-12-15 ENCOUNTER — Telehealth: Payer: Self-pay | Admitting: Gastroenterology

## 2020-12-15 NOTE — Telephone Encounter (Signed)
Error

## 2020-12-20 ENCOUNTER — Encounter: Payer: Self-pay | Admitting: Physician Assistant

## 2020-12-20 ENCOUNTER — Ambulatory Visit: Payer: BC Managed Care – PPO | Admitting: Physician Assistant

## 2020-12-20 VITALS — BP 130/70 | HR 89 | Ht 64.0 in | Wt 113.0 lb

## 2020-12-20 DIAGNOSIS — K642 Third degree hemorrhoids: Secondary | ICD-10-CM

## 2020-12-20 DIAGNOSIS — K625 Hemorrhage of anus and rectum: Secondary | ICD-10-CM

## 2020-12-20 NOTE — Progress Notes (Addendum)
Chief Complaint: Hemorrhoids  HPI:    Mrs. Kelly Houston is a 40 year old female with a past medical history as listed below, who was referred to me by Esperanza Richters, PA-C for a complaint of hemorrhoids.    Today, the patient presents to clinic and tells me that she has had trouble with internal hemorrhoids before, apparently lived down in [place] a couple of years ago and had a large amount of rectal bleeding and was seen by a gastroenterology office who proceeded with a colonoscopy.  She was told that she had grade 3 hemorrhoids and this was otherwise normal.  They discussed that at some point she may need surgery for them.  She explains that she really does not have trouble with her hemorrhoids at all, no pain etc., about 3 weeks ago though she went to [place] and developed an 18-hour "thing", where she had a lot of diarrhea and during that time did see some rectal bleeding, since then everything has resolved.  She does continue to feel a hemorrhoid on the outside though, but this just comes out when I have a bowel movement and I can push it back in.  She really has no complaints but wanted to establish care in case she needs some type of hemorrhoid treatment in the future.    Also describes some occasional right upper quadrant pain and heartburn and reflux which was also evaluated with an ultrasound in the past, this was found to be normal.  She tells me that she has noticed this is worse when she is stressed and if she is not her regular diet.    Works as an Art gallery manager.    Denies fever, chills, weight loss, nausea, vomiting, rectal itching or pain.  Past Medical History:  Diagnosis Date   Allergy    Anxiety    when she travels by air.   Migraines    since age 35   Vertigo    Per patient    Past Surgical History:  Procedure Laterality Date   rt wrist cyst removal. ganglion.      Current Outpatient Medications  Medication Sig Dispense Refill   ALPRAZolam (XANAX XR) 0.5 MG 24 hr tablet  1 tab po q day prn anxiety 10 tablet 0   fluticasone (FLONASE) 50 MCG/ACT nasal spray Place into both nostrils as needed.     naproxen sodium (ALEVE) 220 MG tablet Take 220 mg by mouth as needed.     No current facility-administered medications for this visit.    Allergies as of 12/20/2020 - Review Complete 12/08/2020  Allergen Reaction Noted   Propranolol Hives and Rash 10/31/2001    Family History  Problem Relation Age of Onset   Hypertension Mother    High Cholesterol Father    Depression Brother    Thyroid disease Brother    Lupus Maternal Grandmother     Social History   Socioeconomic History   Marital status: Single    Spouse name: Not on file   Number of children: Not on file   Years of education: Not on file   Highest education level: Not on file  Occupational History   Not on file  Tobacco Use   Smoking status: Never   Smokeless tobacco: Never  Vaping Use   Vaping Use: Never used  Substance and Sexual Activity   Alcohol use: Yes    Comment: socially   Drug use: Never   Sexual activity: Never    Birth control/protection: None  Other  Topics Concern   Not on file  Social History Narrative   Not on file   Social Determinants of Health   Financial Resource Strain: Not on file  Food Insecurity: Not on file  Transportation Needs: Not on file  Physical Activity: Not on file  Stress: Not on file  Social Connections: Not on file  Intimate Partner Violence: Not on file    Review of Systems:    Constitutional: No weight loss, fever or chills Skin: No rash  Cardiovascular: No chest pain Respiratory: No SOB  Gastrointestinal: See HPI and otherwise negative Genitourinary: No dysuria Neurological: No headache, dizziness or syncope Musculoskeletal: No new muscle or joint pain Hematologic: No bleeding  Psychiatric: No history of depression or anxiety   Physical Exam:  Vital signs: BP 130/70   Pulse 89   Ht 5\' 4"  (1.626 m)   Wt 113 lb (51.3 kg)   SpO2  99%   BMI 19.40 kg/m    Constitutional:   Pleasant female appears to be in NAD, Well developed, Well nourished, alert and cooperative Head:  Normocephalic and atraumatic. Eyes:   PEERL, EOMI. No icterus. Conjunctiva pink. Ears:  Normal auditory acuity. Neck:  Supple Throat: Oral cavity and pharynx without inflammation, swelling or lesion.  Respiratory: Respirations even and unlabored. Lungs clear to auscultation bilaterally.   No wheezes, crackles, or rhonchi.  Cardiovascular: Normal S1, S2. No MRG. Regular rate and rhythm. No peripheral edema, cyanosis or pallor.  Gastrointestinal:  Soft, nondistended, nontender. No rebound or guarding. Normal bowel sounds. No appreciable masses or hepatomegaly. Rectal:  External: prolapsed internal hemorrhoid; Internal: tight sphincter Msk:  Symmetrical without gross deformities. Without edema, no deformity or joint abnormality.  Neurologic:  Alert and  oriented x4;  grossly normal neurologically.  Skin:   Dry and intact without significant lesions or rashes. Psychiatric: Demonstrates good judgement and reason without abnormal affect or behaviors.  RELEVANT LABS AND IMAGING: CBC    Component Value Date/Time   WBC 5.9 10/17/2020 1007   RBC 4.42 10/17/2020 1007   HGB 13.4 10/17/2020 1007   HCT 38.7 10/17/2020 1007   PLT 223.0 10/17/2020 1007   MCV 87.5 10/17/2020 1007   MCHC 34.6 10/17/2020 1007   RDW 12.7 10/17/2020 1007   LYMPHSABS 1.4 10/17/2020 1007   MONOABS 0.4 10/17/2020 1007   EOSABS 0.0 10/17/2020 1007   BASOSABS 0.0 10/17/2020 1007    CMP     Component Value Date/Time   NA 137 10/17/2020 1007   K 3.9 10/17/2020 1007   CL 103 10/17/2020 1007   CO2 26 10/17/2020 1007   GLUCOSE 86 10/17/2020 1007   BUN 12 10/17/2020 1007   CREATININE 0.63 10/17/2020 1007   CALCIUM 9.6 10/17/2020 1007   PROT 7.4 10/17/2020 1007   ALBUMIN 4.7 10/17/2020 1007   AST 18 10/17/2020 1007   ALT 15 10/17/2020 1007   ALKPHOS 52 10/17/2020 1007    BILITOT 0.5 10/17/2020 1007    Assessment: 1.  Grade 3 hemorrhoids: Prolapsed internal hemorrhoid seen at time of exam today, no pain, no bleeding, patient is not bothered by this but wanted to establish care with 10/19/2020 2.  Rectal bleeding: Previously evaluated with a colonoscopy 2 years ago in [place] with a finding of grade 3 hemorrhoids and otherwise normal per patient, some bleeding 3 weeks ago with diarrhea after being in [place], but this has since stopped, no further problems; bleeding most likely from above  Plan: 1.  Requested records from patient's last  gastroenterology office, [facility] in [place].  This will include labs, imaging and colonoscopy. 2.  Discussed with the patient that if she does have any trouble from her hemorrhoids in the future, would recommend that we trial steroid suppositories twice daily for a week.  She can call at any point in the next year if she would like to use these.  For now patient tells me she really has no trouble from her hemorrhoids. 3.  Did discuss that we do banding procedures here in the clinic, briefly discussed the procedure for that if it is needed in the future. 4.  Recommend patient continue her high-fiber diet to maintain regular bowel movements. 5.  Patient to follow in clinic with [place] as needed in the future.  She was assigned to Dr. Christella Hartigan this afternoon.  Hyacinth Meeker, PA-C [facility] 12/20/2020, 3:19 PM  Cc: Esperanza Richters, PA-C   Addendum: 12/22/2020 3:19 PM  08/09/2019 colonoscopy at [facility] in [place] by Dr. Ricard Dillon.  This was done for rectal bleeding.  Findings of grade/stage III internal hemorrhoids and normal mucosa in the whole colon.  Biopsies showed colonic mucosa with no specific histopathologic abnormalities and no active or chronic colitis including microscopic colitis.  No change in plans.  Hyacinth Meeker, PA-C

## 2020-12-20 NOTE — Patient Instructions (Signed)
Follow up as needed.  If you are age 38 or older, your body mass index should be between 23-30. Your Body mass index is 19.4 kg/m. If this is out of the aforementioned range listed, please consider follow up with your Primary Care Provider.  If you are age 64 or younger, your body mass index should be between 19-25. Your Body mass index is 19.4 kg/m. If this is out of the aformentioned range listed, please consider follow up with your Primary Care Provider.   __________________________________________________________  The [facility] providers would like to encourage you to use [facility] to communicate with providers for non-urgent requests or questions.  Due to long hold times on the telephone, sending your provider a message by [facility] may be a faster and more efficient way to get a response.  Please allow 48 business hours for a response.  Please remember that this is for non-urgent requests.

## 2020-12-21 NOTE — Progress Notes (Signed)
I agree with the above note, plan 

## 2021-01-08 ENCOUNTER — Other Ambulatory Visit: Payer: Self-pay

## 2021-01-08 ENCOUNTER — Ambulatory Visit: Payer: BC Managed Care – PPO | Admitting: Allergy

## 2021-01-08 ENCOUNTER — Encounter: Payer: Self-pay | Admitting: Allergy

## 2021-01-08 VITALS — BP 124/68 | HR 104 | Temp 98.3°F | Resp 17 | Ht 64.75 in | Wt 111.0 lb

## 2021-01-08 DIAGNOSIS — T781XXD Other adverse food reactions, not elsewhere classified, subsequent encounter: Secondary | ICD-10-CM | POA: Insufficient documentation

## 2021-01-08 DIAGNOSIS — J3089 Other allergic rhinitis: Secondary | ICD-10-CM

## 2021-01-08 DIAGNOSIS — T783XXD Angioneurotic edema, subsequent encounter: Secondary | ICD-10-CM | POA: Diagnosis not present

## 2021-01-08 DIAGNOSIS — T7819XD Other adverse food reactions, not elsewhere classified, subsequent encounter: Secondary | ICD-10-CM

## 2021-01-08 DIAGNOSIS — L509 Urticaria, unspecified: Secondary | ICD-10-CM

## 2021-01-08 DIAGNOSIS — H1013 Acute atopic conjunctivitis, bilateral: Secondary | ICD-10-CM | POA: Diagnosis not present

## 2021-01-08 DIAGNOSIS — T783XXA Angioneurotic edema, initial encounter: Secondary | ICD-10-CM | POA: Insufficient documentation

## 2021-01-08 DIAGNOSIS — T782XXD Anaphylactic shock, unspecified, subsequent encounter: Secondary | ICD-10-CM | POA: Diagnosis not present

## 2021-01-08 MED ORDER — FAMOTIDINE 20 MG PO TABS: 20.0000 mg | ORAL_TABLET | Freq: Two times a day (BID) | ORAL | 3 refills | Status: AC

## 2021-01-08 MED ORDER — CETIRIZINE HCL 10 MG PO TABS: 10.0000 mg | ORAL_TABLET | Freq: Two times a day (BID) | ORAL | 3 refills | Status: DC

## 2021-01-08 NOTE — Assessment & Plan Note (Signed)
Mild rhino conjunctivitis symptoms in the spring. Takes Flonase prn with good benefit.  Today's skin testing showed: Positive to dust mites,cat, and tree pollen. Borderline to dog.  Start environmental control measures as below.  Use over the counter antihistamines such as Zyrtec (cetirizine), Claritin (loratadine), Allegra (fexofenadine), or Xyzal (levocetirizine) daily as needed. May take twice a day during allergy flares. May switch antihistamines every few months.  Use Flonase (fluticasone) nasal spray 1 spray per nostril twice a day as needed for nasal congestion.

## 2021-01-08 NOTE — Assessment & Plan Note (Signed)
.   See assessment and plan as above. 

## 2021-01-08 NOTE — Progress Notes (Signed)
New Patient Note  RE: Kelly Houston MRN: [id number] DOB: [birth year] Date of Office Visit: 01/08/2021  Consult requested by: Kelly Houston Primary care provider: Esperanza Richters, PA-C  Chief Complaint: Rash and Oral Swelling  History of Present Illness: I had the pleasure of seeing Kelly Houston [facility] for [facility]  on 01/08/2021. She is a 40 y.o. female, who is referred here by Kelly Richters, PA-C for the evaluation of allergic reaction - rash/swollen lips.   Symptoms started about 10-12 years ago. The rash can occur anywhere on her body but it is mainly on the face and torso. Describes them as itchy, red, raised and sometimes flat. Individual rashes lasts about 1 day. No ecchymosis upon resolution. Associated symptoms include: lip swelling.   Suspected triggers are unknown. She usually has an episode about once per year but once she has a flare then the symptoms wax and wane for a few months.   Denies any fevers, chills, changes in medications, foods, personal care products or recent infections. She has tried the following therapies: benadryl and steroids with good benefit. Systemic steroids yes. Currently on no daily meds.  Previous work up includes: none. Patient is up to date with the following cancer screening tests: physical, pap smears.  Patient follows with rheumatology due to positive ANA and Raynaud's.   10/17/2020 PCP visit: "Mild seasonal allergies in spring. Random rash on body that occurs once a year with lip swelling. No obvious wheezing or shortness of breath. No use of ace inhibitors. Has occurred yearly since youth/teenager. When she gets this will use benadryl and will calm down quickly. No pattern/cause noted on review."  Reviewed images on the phone - some consistent with urticaria.  Assessment and Plan: [name] is a 40 y.o. female with: Urticaria Pruritic rash with lip angioedema outbreak  for the past 10+ years occurring about once per year for a few months. No triggers noted. Tried benadryl and steroids with good benefit. Following with rheumatology for positive ANA and Raynaud's. Concerned about allergic triggers. Today's skin testing showed: Positive to dust mites,cat, and tree pollen. Borderline to dog and lamb.  Based on clinical history, she likely has chronic idiopathic urticaria. Discussed with patient, that urticaria is usually caused by release of histamine by cutaneous mast cells but sometimes it is non-histamine mediated. Explained that urticaria is not always associated with allergies. In most cases, the exact etiology for urticaria can not be established and it is considered idiopathic. Keep track of episodes and take pictures. At the first onset of symptoms:  Start zyrtec (cetirizine) 10mg  twice a day. If symptoms are not controlled or causes drowsiness let know. Start Pepcid (famotidine) 20mg  twice a day.  Avoid the following potential triggers: alcohol, tight clothing, NSAIDs, hot showers and getting overheated. Get bloodwork to rule out other etiologies.   Angioedema of lips See assessment and plan as above.  Other allergic rhinitis Mild rhino conjunctivitis symptoms in the spring. Takes Flonase prn with good benefit. Today's skin testing showed: Positive to dust mites,cat, and tree pollen. Borderline to dog. Start environmental control measures as below. Use over the counter antihistamines such as Zyrtec (cetirizine), Claritin (loratadine), Allegra (fexofenadine), or Xyzal (levocetirizine) daily as needed. May take twice a day during allergy flares. May switch antihistamines every few months. Use Flonase (fluticasone) nasal spray 1 spray per nostril twice a day as needed for nasal congestion.   Allergic conjunctivitis of both eyes See assessment  and plan as above.  Other adverse food reactions, not elsewhere classified, subsequent encounter History of  facial swelling after shrimp ingestion as a child but now tolerates with no issues. Eggs cause headaches. Rarely eats lamb. Today's skin testing showed borderline positive to lamb - most likely irrelevant sensitization.  Monitor symptoms.   Return in about 7 months (around 08/08/2021).  Meds ordered this encounter  Medications   cetirizine (ZYRTEC) 10 MG tablet    Sig: Take 1 tablet (10 mg total) by mouth in the morning and at bedtime.    Dispense:  60 tablet    Refill:  3   famotidine (PEPCID) 20 MG tablet    Sig: Take 1 tablet (20 mg total) by mouth 2 (two) times daily.    Dispense:  60 tablet    Refill:  3    Lab Orders  Alpha-Gal Panel  Chronic Urticaria  C1 esterase inhibitor, functional  C1 Esterase Inhibitor  C3 and C4  Tryptase    Other allergy screening: Asthma: no Rhino conjunctivitis: yes Sometimes has eye itching at the office only.  Some rhinitis symptoms in the spring and takes Flonase prn with good benefit.  No prior allergy testing.   Food allergy:  Patient had facial swelling as a child after eating shrimp but now tolerates without any issues.   Past work up includes: none.  Dietary History: patient has been eating other foods including limited milk, limited eggs - okay with baked eggs, peanut, treenuts, sesame, shellfish, fish, soy, wheat, meats, fruits and vegetables.  Eggs sometimes cause headaches and not feeling well.  Sister is allergic to green beans.   Medication allergy: yes Hymenoptera allergy: no Eczema:no History of recurrent infections suggestive of immunodeficency: no  Diagnostics: Skin Testing: Environmental allergy panel and select foods. Positive to dust mites,cat, and tree pollen. Borderline to dog and lamb.  Results discussed with patient/family.  Airborne Adult Perc - 01/08/21 0914     Time Antigen Placed 0914    Allergen Manufacturer Waynette ButteryGreer    Location Back    Number of Test 59    Panel 1 Select    1. Control-Buffer 50%  Glycerol Negative    2. Control-Histamine 1 mg/ml 2+    3. Albumin saline Negative    4. Bahia Negative    5. French Southern TerritoriesBermuda Negative    6. Johnson Negative    7. Kentucky Blue Negative    8. Meadow Fescue Negative    9. Perennial Rye Negative    10. Sweet Vernal Negative    11. Timothy Negative    12. Cocklebur Negative    13. Burweed Marshelder Negative    14. Ragweed, short Negative    15. Ragweed, Giant Negative    16. Plantain,  English Negative    17. Lamb's Quarters Negative    18. Sheep Sorrell Negative    19. Rough Pigweed Negative    20. Marsh Elder, Rough Negative    21. Mugwort, Common Negative    22. Ash mix Negative    23. Birch mix Negative    24. Beech American Negative    25. Box, Elder Negative    26. Cedar, red 2+    27. Cottonwood, Guinea-BissauEastern Negative    28. Elm mix Negative    29. Hickory Negative    30. Maple mix Negative    31. Oak, Guinea-Bissau[place]astern mix Negative    32. Pecan Pollen Negative    33. Pine mix Negative    34. Sycamore  Eastern Negative    35. Walnut, Black Pollen Negative    36. Alternaria alternata Negative    37. Cladosporium Herbarum Negative    38. Aspergillus mix Negative    39. Penicillium mix Negative    40. Bipolaris sorokiniana (Helminthosporium) Negative    41. Drechslera spicifera (Curvularia) Negative    42. Mucor plumbeus Negative    43. Fusarium moniliforme Negative    44. Aureobasidium pullulans (pullulara) Negative    45. Rhizopus oryzae Negative    46. Botrytis cinera Negative    47. Epicoccum nigrum Negative    48. Phoma betae Negative    49. Candida Albicans Negative    50. Trichophyton mentagrophytes Negative    51. Mite, D Farinae  5,000 AU/ml 4+    52. Mite, D Pteronyssinus  5,000 AU/ml 4+    53. Cat Hair 10,000 BAU/ml 2+    54.  Dog Epithelia --   +/-   55. Mixed Feathers Negative    56. Horse Epithelia Negative    57. Cockroach, German Negative    58. Mouse Negative    59. Tobacco Leaf Negative             Food  Adult Perc - 01/08/21 0900     Time Antigen Placed 1610    Allergen Manufacturer Waynette Buttery    Location Back    Number of allergen test 72    Panel 2 Select     Control-buffer 50% Glycerol Negative    Control-Histamine 1 mg/ml 2+    1. Peanut Negative    2. Soybean Negative    3. Wheat Negative    4. Sesame Negative    5. Milk, cow Negative    6. Egg White, Chicken Negative    7. Casein Negative    8. Shellfish Mix Negative    9. Fish Mix Negative    10. Cashew Negative    11. Pecan Food Negative    12. Walnut Food Negative    13. Almond Negative    14. Hazelnut Negative    15. [place] nut Negative    16. Coconut Negative    17. Pistachio Negative    18. Catfish Negative    19. Bass Negative    20. Trout Negative    21. Tuna Negative    22. Salmon Negative    23. Flounder Negative    24. Codfish Negative    25. Shrimp Negative    26. Crab Negative    27. Lobster Negative    28. Oyster Negative    29. Scallops Negative    30. Barley Negative    31. Oat  Negative    32. Rye  Negative    33. Hops Negative    34. Rice Negative    35. Cottonseed Negative    36. Saccharomyces Cerevisiae  Negative    37. Pork Negative    38. [place] Meat Negative    39. Chicken Meat Negative    40. Beef Negative    41. Lamb --   2x2   42. Tomato Negative    43. White Potato Negative    44. Sweet Potato Negative    45. Pea, Green/English Negative    46. Navy Bean Negative    47. Mushrooms Negative    48. Avocado Negative    49. Onion Negative    50. Cabbage Negative    51. Carrots Negative    52. Celery Negative    53. Corn Negative  54. Cucumber Negative    55. Grape (White seedless) Negative    56. Orange  Negative    57. Banana Negative    58. Apple Negative    59. Peach Negative    60. Strawberry Negative    61. Cantaloupe Negative    62. Watermelon Negative    63. Pineapple Negative    64. Chocolate/Cacao bean Negative    65. Karaya Gum Negative    66. Acacia (Arabic  Gum) Negative    67. Cinnamon Negative    68. Nutmeg Negative    69. Ginger Negative    70. Garlic Negative    71. Pepper, black Negative    72. Mustard Negative             Past Medical History: Patient Active Problem List   Diagnosis Date Noted   Urticaria 01/08/2021   Angioedema of lips 01/08/2021   Other allergic rhinitis 01/08/2021   Other adverse food reactions, not elsewhere classified, subsequent encounter 01/08/2021   Allergic conjunctivitis of both eyes 01/08/2021   Raynaud's syndrome without gangrene 12/10/2020   Positive ANA (antinuclear antibody) 11/13/2020   Migraines 10/31/1989   Past Medical History:  Diagnosis Date   Allergy    Angio-edema    Anxiety    when she travels by air.   Migraines    since age 25   Vertigo    Per patient   Past Surgical History: Past Surgical History:  Procedure Laterality Date   rt wrist cyst removal. ganglion.     Medication List:  Current Outpatient Medications  Medication Sig Dispense Refill   ALPRAZolam (XANAX XR) 0.5 MG 24 hr tablet 1 tab po q day prn anxiety 10 tablet 0   cetirizine (ZYRTEC) 10 MG tablet Take 1 tablet (10 mg total) by mouth in the morning and at bedtime. 60 tablet 3   famotidine (PEPCID) 20 MG tablet Take 1 tablet (20 mg total) by mouth 2 (two) times daily. 60 tablet 3   fluticasone (FLONASE) 50 MCG/ACT nasal spray Place into both nostrils as needed.     naproxen sodium (ALEVE) 220 MG tablet Take 220 mg by mouth as needed.     olopatadine (PATADAY) 0.1 % ophthalmic solution Place 1 drop into both eyes daily as needed for allergies.     No current facility-administered medications for this visit.   Allergies: Allergies  Allergen Reactions   Propranolol Hives and Rash   Social History: Social History   Socioeconomic History   Marital status: Single    Spouse name: Not on file   Number of children: Not on file   Years of education: Not on file   Highest education level: Not on file   Occupational History   Not on file  Tobacco Use   Smoking status: Never    Passive exposure: Never   Smokeless tobacco: Never  Vaping Use   Vaping Use: Never used  Substance and Sexual Activity   Alcohol use: Yes    Comment: socially   Drug use: Never   Sexual activity: Never    Birth control/protection: None  Other Topics Concern   Not on file  Social History Narrative   Not on file   Social Determinants of Health   Financial Resource Strain: Not on file  Food Insecurity: Not on file  Transportation Needs: Not on file  Physical Activity: Not on file  Stress: Not on file  Social Connections: Not on file   Lives in a 38  year old house. Smoking: denies Occupation: Chemical engineer HistorySurveyor, minerals in the house: no Engineer, civil (consulting) in the family room: yes Carpet in the bedroom: yes Heating: gas Cooling: central Pet: yes 1 cat x 13 yrs  Family History: Family History  Problem Relation Age of Onset   Hypertension Mother    High Cholesterol Father    Urticaria Sister    Angioedema Sister    Depression Brother    Thyroid disease Brother    Lupus Maternal Grandmother    Problem                               Relation Asthma                                   No  Eczema                                No  Food allergy                          No  Allergic rhino conjunctivitis     No   Review of Systems  Constitutional:  Negative for appetite change, chills, fever and unexpected weight change.  HENT:  Negative for congestion and rhinorrhea.   Eyes:  Positive for itching.  Respiratory:  Negative for cough, chest tightness, shortness of breath and wheezing.   Cardiovascular:  Negative for chest pain.  Gastrointestinal:  Negative for abdominal pain.  Genitourinary:  Negative for difficulty urinating.  Skin:  Positive for rash.  Allergic/Immunologic: Positive for environmental allergies.  Neurological:  Negative for headaches.   Objective: BP  124/68   Pulse (!) 104   Temp 98.3 F (36.8 C) (Temporal)   Resp 17   Ht 5' 4.75" (1.645 m)   Wt 111 lb (50.3 kg)   SpO2 100%   BMI 18.61 kg/m  Body mass index is 18.61 kg/m. Physical Exam Vitals and nursing note reviewed.  Constitutional:      Appearance: Normal appearance. She is well-developed.  HENT:     Head: Normocephalic and atraumatic.     Right Ear: External ear normal.     Left Ear: External ear normal.     Nose: Nose normal.     Mouth/Throat:     Mouth: Mucous membranes are moist.     Pharynx: Oropharynx is clear.  Eyes:     Conjunctiva/sclera: Conjunctivae normal.  Cardiovascular:     Rate and Rhythm: Normal rate and regular rhythm.     Heart sounds: Normal heart sounds. No murmur heard.   No friction rub. No gallop.  Pulmonary:     Effort: Pulmonary effort is normal.     Breath sounds: Normal breath sounds. No wheezing, rhonchi or rales.  Abdominal:     Palpations: Abdomen is soft.  Musculoskeletal:     Cervical back: Neck supple.  Skin:    General: Skin is warm.     Findings: No rash.  Neurological:     Mental Status: She is alert and oriented to person, place, and time.  Psychiatric:        Behavior: Behavior normal.  The plan was reviewed with the patient/family, and all questions/concerned were addressed.  It was my pleasure to see Anay  today and participate in her care. Please feel free to contact me with any questions or concerns.  Sincerely,  Wyline Mood, DO Allergy & Immunology  [facility] office: [phone] [facility] office: [phone]

## 2021-01-08 NOTE — Patient Instructions (Addendum)
Today's skin testing showed: Positive to dust mites,cat, and tree pollen. Borderline to dog and lamb.  Results given.   Rash/lip swelling: Based on clinical history, she likely has chronic idiopathic urticaria. Discussed with patient, that urticaria is usually caused by release of histamine by cutaneous mast cells but sometimes it is non-histamine mediated. Explained that urticaria is not always associated with allergies. In most cases, the exact etiology for urticaria can not be established and it is considered idiopathic.  Keep track of episodes and take pictures. At the first onset of symptoms:  Start zyrtec (cetirizine) 10mg  twice a day. If symptoms are not controlled or causes drowsiness let know. Start pepcid (famotidine) 20mg  twice a day.  Avoid the following potential triggers: alcohol, tight clothing, NSAIDs, hot showers and getting overheated.  Get bloodwork:  We are ordering labs, so please allow 1-2 weeks for the results to come back. With the newly implemented Cures Act, the labs might be visible to you at the same time that they become visible to me. However, I will not address the results until all of the results are back, so please be patient.   Environmental allergies Start environmental control measures as below. Use over the counter antihistamines such as Zyrtec (cetirizine), Claritin (loratadine), Allegra (fexofenadine), or Xyzal (levocetirizine) daily as needed. May take twice a day during allergy flares. May switch antihistamines every few months. Use Flonase (fluticasone) nasal spray 1 spray per nostril twice a day as needed for nasal congestion.   Follow up in 7 months or sooner if needed.   Reducing Pollen Exposure Pollen seasons: trees (spring), grass (summer) and ragweed/weeds (fall). Keep windows closed in your home and car to lower pollen exposure.  Install air conditioning in the bedroom and throughout the house if possible.  Avoid going out in dry windy  days - especially early morning. Pollen counts are highest between 5 - 10 AM and on dry, hot and windy days.  Save outside activities for late afternoon or after a heavy rain, when pollen levels are lower.  Avoid mowing of grass if you have grass pollen allergy. Be aware that pollen can also be transported indoors on people and pets.  Dry your clothes in an automatic dryer rather than hanging them outside where they might collect pollen.  Rinse hair and eyes before bedtime. Control of House Dust Mite Allergen Dust mite allergens are a common trigger of allergy and asthma symptoms. While they can be found throughout the house, these microscopic creatures thrive in warm, humid environments such as bedding, upholstered furniture and carpeting. Because so much time is spent in the bedroom, it is essential to reduce mite levels there.  Encase pillows, mattresses, and box springs in special allergen-proof fabric covers or airtight, zippered plastic covers.  Bedding should be washed weekly in hot water (130 F) and dried in a hot dryer. Allergen-proof covers are available for comforters and pillows that can't be regularly washed.  Wash the allergy-proof covers every few months. Minimize clutter in the bedroom. Keep pets out of the bedroom.  Keep humidity less than 50% by using a dehumidifier or air conditioning. You can buy a humidity measuring device called a hygrometer to monitor this.  If possible, replace carpets with hardwood, linoleum, or washable area rugs. If that's not possible, vacuum frequently with a vacuum that has a HEPA filter. Remove all upholstered furniture and non-washable window drapes from the bedroom. Remove all non-washable stuffed toys from the bedroom.  Wash stuffed toys  weekly. Pet Allergen Avoidance: Contrary to popular opinion, there are no "hypoallergenic" breeds of dogs or cats. That is because people are not allergic to an animal's hair, but to an allergen found in the  animal's saliva, dander (dead skin flakes) or urine. Pet allergy symptoms typically occur within minutes. For some people, symptoms can build up and become most severe 8 to 12 hours after contact with the animal. People with severe allergies can experience reactions in public places if dander has been transported on the pet owners' clothing. Keeping an animal outdoors is only a partial solution, since homes with pets in the yard still have higher concentrations of animal allergens. Before getting a pet, ask your allergist to determine if you are allergic to animals. If your pet is already considered part of your family, try to minimize contact and keep the pet out of the bedroom and other rooms where you spend a great deal of time. As with dust mites, vacuum carpets often or replace carpet with a hardwood floor, tile or linoleum. High-efficiency particulate air (HEPA) cleaners can reduce allergen levels over time. While dander and saliva are the source of cat and dog allergens, urine is the source of allergens from rabbits, hamsters, mice and [place] pigs; so ask a non-allergic family member to clean the animal's cage. If you have a pet allergy, talk to your allergist about the potential for allergy immunotherapy (allergy shots). This strategy can often provide long-term relief.

## 2021-01-08 NOTE — Assessment & Plan Note (Addendum)
Pruritic rash with lip angioedema outbreak for the past 10+ years occurring about once per year for a few months. No triggers noted. Tried benadryl and steroids with good benefit. Following with rheumatology for positive ANA and Raynaud's. Concerned about allergic triggers.  Today's skin testing showed: Positive to dust mites,cat, and tree pollen. Borderline to dog and lamb.  . Based on clinical history, she likely has chronic idiopathic urticaria. Discussed with patient, that urticaria is usually caused by release of histamine by cutaneous mast cells but sometimes it is non-histamine mediated. Explained that urticaria is not always associated with allergies. In most cases, the exact etiology for urticaria can not be established and it is considered idiopathic. Marland Kitchen Keep track of episodes and take pictures. . At the first onset of symptoms:   Start zyrtec (cetirizine) 10mg  twice a day.  If symptoms are not controlled or causes drowsiness let know.  Start Pepcid (famotidine) 20mg  twice a day.  . Avoid the following potential triggers: alcohol, tight clothing, NSAIDs, hot showers and getting overheated. . Get bloodwork to rule out other etiologies.

## 2021-01-08 NOTE — Assessment & Plan Note (Signed)
History of facial swelling after shrimp ingestion as a child but now tolerates with no issues. Eggs cause headaches. Rarely eats lamb.  Today's skin testing showed borderline positive to lamb - most likely irrelevant sensitization.   Monitor symptoms.

## 2021-01-17 LAB — ALPHA-GAL PANEL
Allergen Lamb IgE: <0.1 kU/L
Beef IgE: <0.1 kU/L
IgE (Immunoglobulin E), Serum: 98 IU/mL (ref 6–495)
O215-IgE Alpha-Gal: <0.1 kU/L
Pork IgE: <0.1 kU/L

## 2021-01-17 LAB — C3 AND C4
Complement C3, Serum: 147 mg/dL (ref 82–167)
Complement C4, Serum: 28 mg/dL (ref 12–38)

## 2021-01-17 LAB — TRYPTASE: Tryptase: 2.4 ug/L (ref 2.2–13.2)

## 2021-01-17 LAB — C1 ESTERASE INHIBITOR, FUNCTIONAL: C1INH Functional/C1INH Total MFr SerPl: >100 %mean normal

## 2021-01-17 LAB — C1 ESTERASE INHIBITOR: C1INH SerPl-mCnc: 38 mg/dL (ref 21–39)

## 2021-01-17 LAB — CHRONIC URTICARIA: cu index: 2.9 (ref <10)

## 2021-02-23 ENCOUNTER — Other Ambulatory Visit: Payer: Self-pay | Admitting: Medical

## 2021-02-26 MED ORDER — ALPRAZOLAM ER 0.5 MG PO TB24: ORAL_TABLET | ORAL | 0 refills | Status: DC

## 2021-02-26 NOTE — Telephone Encounter (Addendum)
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   Requesting: alprazolam 0.5mg   Contract: None UDS: None Last Visit: 10/17/2020 Next Visit: None Last Refill: 10/17/2020 #10 and 0RF  Please Advise.  Pt has anxiety flying. 10 tab used sparingly prior to flights. 10 tab use over past 4 months. Refilling. Contract and uds not required.

## 2021-06-13 ENCOUNTER — Ambulatory Visit: Payer: BC Managed Care – PPO | Admitting: Internal Medicine

## 2021-07-01 NOTE — Progress Notes (Signed)
Office Visit Note  Patient: Kelly Houston             Date of Birth: 1981-01-01           MRN: [id number]             PCP: Elise Benne Referring: Elise Benne Visit Date: 07/02/2021   Subjective:   History of Present Illness: Kelly Houston is a 41 y.o. female here for follow up for positive ANA with joint pains, raynaud's symptoms, and urticarial rashes observing with stable symptoms about 6 months ago. She continues to have a positional tremor, worse since off propanolol which was causing rashes. Hands are ice cold all the time, better in bed worse during day worse if body gets cold. She has not developed any lesions or skin damage but it is frequent and severe enough to bother her a lot.  Previous HPI 12/06/20 Kelly Houston is a 41 y.o. female here for follow up for positive ANA and chronic joint pains. Lab testing for the positive ANA at intiial visit was mostly negative, Scl-70 Ab was very low positive at 1.1. She also had a recent tick bite on the left side of her torso that did not look concerning with probable short attachment time and not an endemic region.  Since last visit she continues to have the same symptoms with episodic discoloration in fingers and toes numbness and paresthesia in her arms and hands bilaterally and positional tremor especially on the right hand.  The tick bite rash on her left flank resolved completely.     Previous HPI 11/13/20 Kelly Houston is a 41 y.o. female here for evaluation of positive ANA and joint pains. She has experienced some degree of symptoms for years. More recently she describes increases in numbness episodically affecting the left worse than right arms and in the left foot and great toe. She notices hand pain and stiffness increased with cold exposure. Sleeping on either side often provokes waking overnight or early morning with numbness down the left arm. She has some pain and  tightness in the low back limiting hip range of movement. She has had some previous autoimmune workup apparently, she reports what sounds like positive autoantibody tests in the past. She also reports a recent tick bite over the past weekend. She was hiking outdoors on Saturday and noticed a tick attached to her left flank Sunday. She did shower after outdoors exposure Saturday but does not recall any insect at that time. Currently small erythematous rash present without any associated pain or sensation.   Labs reviewed 10/2020 ANA 1:80 speckled TSH 1.51 RF neg ESR 10 CRP neg CBC wnl CMP wnl   Review of Systems  Constitutional:  Negative for fever.  HENT:  Negative for mouth sores.   Respiratory:  Negative for shortness of breath.   Skin:  Positive for color change. Negative for rash.  Neurological:  Positive for tremors and headaches.   PMFS History:  Patient Active Problem List   Diagnosis Date Noted   Urticaria 01/08/2021   Angioedema of lips 01/08/2021   Other allergic rhinitis 01/08/2021   Other adverse food reactions, not elsewhere classified, subsequent encounter 01/08/2021   Allergic conjunctivitis of both eyes 01/08/2021   Raynaud's syndrome without gangrene 12/10/2020   Positive ANA (antinuclear antibody) 11/13/2020   Migraines 10/31/1989    Past Medical History:  Diagnosis Date   Allergy    Angio-edema    Anxiety  when she travels by air.   Migraines    since age 44   Vertigo    Per patient    Family History  Problem Relation Age of Onset   Hypertension Mother    High Cholesterol Father    Urticaria Sister    Angioedema Sister    Depression Brother    Thyroid disease Brother    Lupus Maternal Grandmother    Past Surgical History:  Procedure Laterality Date   rt wrist cyst removal. ganglion.     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  Cardiovascular:     Rate and Rhythm: Normal rate and regular rhythm.  Pulmonary:     Effort: Pulmonary effort is normal.     Breath sounds: Normal breath sounds.  Musculoskeletal:     Right lower leg: No edema.     Left lower leg: No edema.  Skin:    Findings: No rash.  Neurological:     General: No focal deficit present.     Motor: No weakness.     Comments: Bilateral positional tremor in hands  Psychiatric:        Mood and Affect: Mood normal.     Musculoskeletal Exam:  Elbows full ROM no tenderness or swelling Wrists full ROM no tenderness or swelling Fingers full ROM no tenderness or swelling Knees full ROM no tenderness or swelling   Investigation: No additional findings.  Imaging: No results found.  Recent Labs: Lab Results  Component Value Date   WBC 5.9 10/17/2020   HGB 13.4 10/17/2020   PLT 223.0 10/17/2020   NA 137 10/17/2020   K 3.9 10/17/2020   CL 103 10/17/2020   CO2 26 10/17/2020   GLUCOSE 86 10/17/2020   BUN 12 10/17/2020   CREATININE 0.63 10/17/2020   BILITOT 0.5 10/17/2020   ALKPHOS 52 10/17/2020   AST 18 10/17/2020   ALT 15 10/17/2020   PROT 7.4 10/17/2020   ALBUMIN 4.7 10/17/2020   CALCIUM 9.6 10/17/2020    Speciality Comments: No specialty comments available.  Procedures:  No procedures performed Allergies: Propranolol   Assessment / Plan:     Visit Diagnoses: Raynaud's syndrome without gangrene - Plan: amLODipine (NORVASC) 5 MG tablet  Raynaud's symptoms doing somewhat worse still no evidence of ischemic injury.  She is already taking steps to protect her  hands wearing gloves much of the time and warming her core temperature.  Recommend trial of treatment with amlodipine 5 mg daily.  She has normal blood pressure discussed to watch out for any orthostatic symptoms or leg edema.  Also risk of worsening migraines which she has intermittently.  Positive ANA (antinuclear antibody)  Still without very specific clinical manifestations and preferring conservative approach to treatment which is fine.  I recommend we could consider retesting labs but no reason to at this point.  Migraine without status migrainosus, not intractable, unspecified migraine type  Orders: No orders of the defined types were placed in this encounter.  Meds ordered this encounter  Medications   amLODipine (NORVASC) 5 MG tablet    Sig: Take 1 tablet (5 mg total) by mouth daily.    Dispense:  30 tablet    Refill:  1     Follow-Up Instructions: Return in about 4 weeks (around 07/30/2021) for Raynaudes amlodipine start f/u 4wks.   Collier Salina, MD  Note - This record has been created using [employer].  Chart creation errors have been sought, but may not always  have been located. Such creation errors do not reflect on  the standard of medical care.

## 2021-07-02 ENCOUNTER — Ambulatory Visit: Payer: BC Managed Care – PPO | Admitting: Internal Medicine

## 2021-07-02 ENCOUNTER — Encounter: Payer: Self-pay | Admitting: Internal Medicine

## 2021-07-02 ENCOUNTER — Other Ambulatory Visit: Payer: Self-pay

## 2021-07-02 VITALS — BP 146/84 | HR 92 | Resp 12 | Ht 64.0 in | Wt 119.4 lb

## 2021-07-02 DIAGNOSIS — R768 Other specified abnormal immunological findings in serum: Secondary | ICD-10-CM

## 2021-07-02 DIAGNOSIS — I7300 Raynaud's syndrome without gangrene: Secondary | ICD-10-CM

## 2021-07-02 DIAGNOSIS — G43909 Migraine, unspecified, not intractable, without status migrainosus: Secondary | ICD-10-CM | POA: Diagnosis not present

## 2021-07-02 MED ORDER — AMLODIPINE BESYLATE 5 MG PO TABS: 5.0000 mg | ORAL_TABLET | Freq: Every day | ORAL | 1 refills | Status: DC

## 2021-07-02 NOTE — Patient Instructions (Addendum)
I recommend trying amlodipine 5 mg once daily for your raynaud's symptoms. Watch out for any side effects such as lightheadedness, leg swelling, or migraine headache worsening. Otherwise we can follow up in 1 month to see if there is a significant symptom improvement.

## 2021-07-04 ENCOUNTER — Other Ambulatory Visit: Payer: Self-pay | Admitting: Allergy

## 2021-07-12 ENCOUNTER — Ambulatory Visit: Payer: BC Managed Care – PPO | Admitting: Obstetrics and Gynecology

## 2021-07-12 ENCOUNTER — Other Ambulatory Visit: Payer: Self-pay

## 2021-07-12 ENCOUNTER — Encounter: Payer: Self-pay | Admitting: Obstetrics and Gynecology

## 2021-07-12 VITALS — BP 143/75 | HR 78 | Resp 16 | Ht 64.0 in | Wt 116.0 lb

## 2021-07-12 DIAGNOSIS — N6315 Unspecified lump in the right breast, overlapping quadrants: Secondary | ICD-10-CM | POA: Diagnosis not present

## 2021-07-12 NOTE — Progress Notes (Signed)
41 yo P0 with BMI 19 presenting today for the evaluation of a right breast mass. Patient reports a known history of cystic breasts but noticed a larger mass in her right breast in January. This mass increased in size since. She denies any breast pain or drainage from nipple. Patient is without any other complaints  Past Medical History:  Diagnosis Date   Allergy    Angio-edema    Anxiety    when she travels by air.   Migraines    since age 37   Vertigo    Per patient   Past Surgical History:  Procedure Laterality Date   rt wrist cyst removal. ganglion.     Family History  Problem Relation Age of Onset   Hypertension Mother    High Cholesterol Father    Urticaria Sister    Angioedema Sister    Depression Brother    Thyroid disease Brother    Lupus Maternal Grandmother    Social History   Tobacco Use   Smoking status: Never    Passive exposure: Never   Smokeless tobacco: Never  Vaping Use   Vaping Use: Never used  Substance Use Topics   Alcohol use: Yes    Comment: socially   Drug use: Never   ROS See pertinent in HPI. All other systems reviewed and non contributory Blood pressure (!) 143/75, pulse 78, resp. rate 16, height 5\' 4"  (1.626 m), weight 116 lb (52.6 kg), last menstrual period 06/29/2021. GENERAL: Well-developed, well-nourished female in no acute distress.  BREASTS: Symmetric in size. Palpable 2.5 x 3 cm right breast mass in the outer lower quadrant overlapping the right outer quadrant. No lymphadenopathy, skin changes, or nipple drainage. ABDOMEN: Soft, nontender, nondistended. No organomegaly. EXTREMITIES: No cyanosis, clubbing, or edema, 2+ distal pulses.  A/P 41 yo with right breast mass - patient referred for a diagnostic right breast mammogram - Patient will be contacted with abnormal results

## 2021-07-13 ENCOUNTER — Other Ambulatory Visit: Payer: Self-pay | Admitting: Obstetrics and Gynecology

## 2021-07-13 DIAGNOSIS — N6310 Unspecified lump in the right breast, unspecified quadrant: Secondary | ICD-10-CM

## 2021-07-26 ENCOUNTER — Other Ambulatory Visit: Payer: Self-pay | Admitting: Internal Medicine

## 2021-07-26 DIAGNOSIS — I7300 Raynaud's syndrome without gangrene: Secondary | ICD-10-CM

## 2021-07-28 ENCOUNTER — Inpatient Hospital Stay: Admission: RE | Admit: 2021-07-28 | Payer: BC Managed Care – PPO | Source: Ambulatory Visit

## 2021-08-01 ENCOUNTER — Other Ambulatory Visit: Payer: Self-pay

## 2021-08-01 ENCOUNTER — Encounter: Payer: Self-pay | Admitting: Internal Medicine

## 2021-08-01 ENCOUNTER — Ambulatory Visit: Payer: BC Managed Care – PPO | Admitting: Internal Medicine

## 2021-08-01 VITALS — BP 142/76 | HR 85 | Resp 14 | Ht 64.0 in | Wt 119.0 lb

## 2021-08-01 DIAGNOSIS — L509 Urticaria, unspecified: Secondary | ICD-10-CM | POA: Diagnosis not present

## 2021-08-01 DIAGNOSIS — I7300 Raynaud's syndrome without gangrene: Secondary | ICD-10-CM | POA: Diagnosis not present

## 2021-08-01 DIAGNOSIS — R768 Other specified abnormal immunological findings in serum: Secondary | ICD-10-CM | POA: Diagnosis not present

## 2021-08-01 MED ORDER — AMLODIPINE BESYLATE 5 MG PO TABS: 5.0000 mg | ORAL_TABLET | Freq: Every day | ORAL | 1 refills | Status: DC

## 2021-08-01 NOTE — Progress Notes (Signed)
Office Visit Note  Patient: Kelly Houston             Date of Birth: 01-24-1981           MRN: [id number]             PCP: Marisue Brooklyn Referring: Marisue Brooklyn Visit Date: 08/01/2021   Subjective:  Follow-up (Doing good)   History of Present Illness: Tamre Cass is a 41 y.o. female here for follow up for raynaud's symptoms after starting amlodipine 5 mg daily. She also continues having some generalized symptoms with positional tremor, urticarial rashes, and joint pains. She notices partial improvement in hand discoloration and numbness but continues to have very cold fingers and toes. She notices some small nodules on the left index finger mostly none at the tip of the finger. No leg swelling migraines or orthostatic symptoms after starting amlodipine.  Previous HPI 07/02/21 Tela Kotecki is a 41 y.o. female here for follow up for positive ANA with joint pains, raynaud's symptoms, and urticarial rashes observing with stable symptoms about 6 months ago. She continues to have a positional tremor, worse since off propanolol which was causing rashes. Hands are ice cold all the time, better in bed worse during day worse if body gets cold. She has not developed any lesions or skin damage but it is frequent and severe enough to bother her a lot.    Previous HPI 11/13/20 Kevina Piloto is a 41 y.o. female here for evaluation of positive ANA and joint pains. She has experienced some degree of symptoms for years. More recently she describes increases in numbness episodically affecting the left worse than right arms and in the left foot and great toe. She notices hand pain and stiffness increased with cold exposure. Sleeping on either side often provokes waking overnight or early morning with numbness down the left arm. She has some pain and tightness in the low back limiting hip range of movement. She has had some previous autoimmune  workup apparently, she reports what sounds like positive autoantibody tests in the past. She also reports a recent tick bite over the past weekend. She was hiking outdoors on Saturday and noticed a tick attached to her left flank Sunday. She did shower after outdoors exposure Saturday but does not recall any insect at that time. Currently small erythematous rash present without any associated pain or sensation.   Review of Systems  Constitutional:  Negative for fatigue.  HENT:  Negative for mouth dryness.   Eyes:  Positive for dryness.  Respiratory:  Negative for shortness of breath.   Cardiovascular:  Negative for swelling in legs/feet.  Gastrointestinal:  Negative for constipation.  Endocrine: Positive for cold intolerance, heat intolerance and excessive thirst.  Genitourinary:  Negative for difficulty urinating.  Musculoskeletal:  Positive for morning stiffness.  Skin:  Negative for rash.  Allergic/Immunologic: Negative for susceptible to infections.  Neurological:  Positive for numbness.  Hematological:  Negative for bruising/bleeding tendency.  Psychiatric/Behavioral:  Positive for sleep disturbance.    PMFS History:  Patient Active Problem List   Diagnosis Date Noted   Urticaria 01/08/2021   Angioedema of lips 01/08/2021   Other allergic rhinitis 01/08/2021   Other adverse food reactions, not elsewhere classified, subsequent encounter 01/08/2021   Allergic conjunctivitis of both eyes 01/08/2021   Raynaud's syndrome without gangrene 12/10/2020   Positive ANA (antinuclear antibody) 11/13/2020   Migraines 10/31/1989    Past Medical History:  Diagnosis Date  Allergy    Angio-edema    Anxiety    when she travels by air.   Migraines    since age 84   Vertigo    Per patient    Family History  Problem Relation Age of Onset   Hypertension Mother    High Cholesterol Father    Urticaria Sister    Angioedema Sister    Depression Brother    Thyroid disease Brother    Lupus  Maternal Grandmother    Past Surgical History:  Procedure Laterality Date   rt wrist cyst removal. ganglion.     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  Musculoskeletal:     Right lower leg: No edema.     Left lower leg: No edema.  Skin:    General: Skin is dry.     Comments: Cool fingers with faint erythema, no digital pitting, normal capillary refill  Neurological:     Mental Status: She is alert.  Psychiatric:        Mood and Affect: Mood normal.     Musculoskeletal Exam:  Elbows full ROM no tenderness or swelling Wrists full ROM no tenderness or swelling Fingers full ROM no tenderness or swelling Knees full ROM no tenderness or swelling Ankles full ROM no tenderness or swelling  Limited ultrasound inspection of wrists appears normal median nerve CSA of 8 mm^2 on right and 9 mm^2 on left  Investigation: No additional findings.  Imaging: No results found.  Recent Labs: Lab Results  Component Value Date   WBC 5.9 10/17/2020   HGB 13.4 10/17/2020   PLT 223.0 10/17/2020   NA 137 10/17/2020   K 3.9 10/17/2020   CL 103 10/17/2020   CO2 26 10/17/2020   GLUCOSE 86 10/17/2020   BUN 12 10/17/2020   CREATININE 0.63 10/17/2020   BILITOT 0.5 10/17/2020   ALKPHOS 52 10/17/2020   AST 18 10/17/2020   ALT 15 10/17/2020   PROT 7.4 10/17/2020   ALBUMIN 4.7 10/17/2020   CALCIUM 9.6 10/17/2020    Speciality Comments: No specialty comments available.  Procedures:  No procedures  performed Allergies: Propranolol   Assessment / Plan:     Visit Diagnoses: Raynaud's syndrome without gangrene - Plan: amLODipine (NORVASC) 5 MG tablet  Symptoms are better than before though definitely not completely resolved.  Discussed possibility of titrating amlodipine dose but will plan to keep at 5 mg daily at this time.  She does have predisposition to migrainous headaches but is tolerating the current medication dose.  No evidence of pitting or ischemic injury in fingertips on exam today.  Positive ANA (antinuclear antibody) Urticaria  No new inflammatory changes no active skin rash or hives on examination today.  Orders: No orders of the defined types were placed in this encounter.  Meds ordered this encounter  Medications   amLODipine (NORVASC) 5 MG tablet    Sig: Take 1 tablet (5 mg total)  by mouth daily.    Dispense:  90 tablet    Refill:  1     Follow-Up Instructions: Return in about 6 months (around 02/01/2022) for Rayaud's on amlodipine/?ANA f/u 80mos.   Fuller Plan, MD  Note - This record has been created using [employer].  Chart creation errors have been sought, but may not always  have been located. Such creation errors do not reflect on  the standard of medical care.

## 2021-08-08 ENCOUNTER — Ambulatory Visit: Payer: BC Managed Care – PPO | Admitting: Allergy

## 2021-08-09 ENCOUNTER — Other Ambulatory Visit: Payer: BC Managed Care – PPO

## 2021-08-09 ENCOUNTER — Ambulatory Visit: Payer: BC Managed Care – PPO | Admitting: Allergy

## 2021-08-09 ENCOUNTER — Encounter: Payer: Self-pay | Admitting: Allergy

## 2021-08-09 ENCOUNTER — Other Ambulatory Visit: Payer: Self-pay

## 2021-08-09 VITALS — BP 126/80 | HR 78 | Temp 98.7°F | Resp 18 | Ht 64.0 in | Wt 118.8 lb

## 2021-08-09 DIAGNOSIS — H1013 Acute atopic conjunctivitis, bilateral: Secondary | ICD-10-CM

## 2021-08-09 DIAGNOSIS — L509 Urticaria, unspecified: Secondary | ICD-10-CM

## 2021-08-09 DIAGNOSIS — J3089 Other allergic rhinitis: Secondary | ICD-10-CM

## 2021-08-09 DIAGNOSIS — J302 Other seasonal allergic rhinitis: Secondary | ICD-10-CM

## 2021-08-09 NOTE — Patient Instructions (Addendum)
Rash/lip swelling: ?Keep track of episodes and take pictures. ?At the first onset of symptoms:  ?Start zyrtec (cetirizine) 10mg  twice a day. ?If symptoms are not controlled or causes drowsiness let know. ?Start famotidine 20mg  twice a day.  ?Avoid the following potential triggers: alcohol, tight clothing, NSAIDs, hot showers and getting overheated. ? ?Environmental allergies ?2022 skin testing showed: Positive to dust mites,cat, and tree pollen. Borderline to dog. ?Continue environmental control measures as below. ?Take zyrtec 10mg  daily in the spring months (from March through May) ? ?Use Flonase (fluticasone) nasal spray 1 spray per nostril twice a day as needed for nasal congestion.  ?Use olopatadine eye drops 0.2% once a day as needed for itchy/watery eyes. ? ?Follow up in 12 months or sooner if needed.  ? ?Reducing Pollen Exposure ?Pollen seasons: trees (spring), grass (summer) and ragweed/weeds (fall). ?Keep windows closed in your home and car to lower pollen exposure.  ?Install air conditioning in the bedroom and throughout the house if possible.  ?Avoid going out in dry windy days - especially early morning. ?Pollen counts are highest between 5 - 10 AM and on dry, hot and windy days.  ?Save outside activities for late afternoon or after a heavy rain, when pollen levels are lower.  ?Avoid mowing of grass if you have grass pollen allergy. ?Be aware that pollen can also be transported indoors on people and pets.  ?Dry your clothes in an automatic dryer rather than hanging them outside where they might collect pollen.  ?Rinse hair and eyes before bedtime. ?Control of House Dust Mite Allergen ?Dust mite allergens are a common trigger of allergy and asthma symptoms. While they can be found throughout the house, these microscopic creatures thrive in warm, humid environments such as bedding, upholstered furniture and carpeting. ?Because so much time is spent in the bedroom, it is essential to reduce mite levels  there.  ?Encase pillows, mattresses, and box springs in special allergen-proof fabric covers or airtight, zippered plastic covers.  ?Bedding should be washed weekly in hot water (130? F) and dried in a hot dryer. Allergen-proof covers are available for comforters and pillows that can?t be regularly washed.  ?Wash the allergy-proof covers every few months. Minimize clutter in the bedroom. Keep pets out of the bedroom.  ?Keep humidity less than 50% by using a dehumidifier or air conditioning. You can buy a humidity measuring device called a hygrometer to monitor this.  ?If possible, replace carpets with hardwood, linoleum, or washable area rugs. If that's not possible, vacuum frequently with a vacuum that has a HEPA filter. ?Remove all upholstered furniture and non-washable window drapes from the bedroom. ?Remove all non-washable stuffed toys from the bedroom.  Wash stuffed toys weekly. ?Pet Allergen Avoidance: ?Contrary to popular opinion, there are no ?hypoallergenic? breeds of dogs or cats. That is because people are not allergic to an animal?s hair, but to an allergen found in the animal's saliva, dander (dead skin flakes) or urine. Pet allergy symptoms typically occur within minutes. For some people, symptoms can build up and become most severe 8 to 12 hours after contact with the animal. People with severe allergies can experience reactions in public places if dander has been transported on the pet owners? clothing. ?Keeping an animal outdoors is only a partial solution, since homes with pets in the yard still have higher concentrations of animal allergens. ?Before getting a pet, ask your allergist to determine if you are allergic to animals. If your pet is already considered part  of your family, try to minimize contact and keep the pet out of the bedroom and other rooms where you spend a great deal of time. ?As with dust mites, vacuum carpets often or replace carpet with a hardwood floor, tile or  linoleum. ?High-efficiency particulate air (HEPA) cleaners can reduce allergen levels over time. ?While dander and saliva are the source of cat and dog allergens, urine is the source of allergens from rabbits, hamsters, mice and [place] pigs; so ask a non-allergic family member to clean the animal?s cage. ?If you have a pet allergy, talk to your allergist about the potential for allergy immunotherapy (allergy shots). This strategy can often provide long-term relief. ? ? ?

## 2021-08-09 NOTE — Progress Notes (Signed)
? ?Follow Up Note ? ?RE: Kelly Houston MRN: [id number] DOB: [birth year] ?Date of Office Visit: 08/09/2021 ? ?Referring provider: Esperanza Richters, PA-C ?Primary care provider: Esperanza Richters, PA-C ? ?Chief Complaint: Urticaria (Not sure the cause broke out in hives on her neck with constant itching. Did not use any creams or take prescription to help with symptoms. ) and Angioedema (Hive breakouts usually start with lip swelling, but recent break out did not. ) ? ?History of Present Illness: ?I had the pleasure of seeing Kelly Houston [facility] for a follow up visit at the [facility] on 08/09/2021. She is a 41 y.o. female, who is being followed for urticaria with angioedema, allergic rhinoconjunctivitis and adverse food reaction. Her previous allergy office visit was on 01/08/2021 with Dr. Selena Batten. Today is a regular follow up visit. ? ?Urticaria ?Last week she got some hives on her neck which lasted for 4 days. Denies any insect/bug bites. Denies any changes in diet, meds, personal care products.  ? ?Patient has not been taking zyrtec or famotidine.  ? ?No more swelling episodes.  ? ?Started on new medication for Raynaud's. ? ?Allergic rhino conjunctivitis ?Only using zyrtec prn and using eye drops as needed.  ?Used Flonase when in [place] and had some nasal congestion and headaches.  ? ?Assessment and Plan: ?Kelly Houston is a 41 y.o. female with: ?Urticaria ?Past history - Pruritic rash with lip angioedema outbreak for the past 10+ years occurring about once per year for a few months. No triggers noted. Tried benadryl and steroids with good benefit. Following with rheumatology for positive ANA and Raynaud's.  ?Interim history - no hives until last week on her neck, lasted for a few days. Did not take antihistamines. 2022 bloodwork unremarkable. No swelling episodes.  ?Keep track of episodes and take pictures. ?At the first onset of symptoms:  ?Start zyrtec (cetirizine) 10mg  twice a day. ?If  symptoms are not controlled or causes drowsiness let know. ?Start famotidine 20mg  twice a day.  ?Avoid the following potential triggers: alcohol, tight clothing, NSAIDs, hot showers and getting overheated. ? ?Seasonal and perennial allergic rhinoconjunctivitis ?Past history - Mild rhino conjunctivitis symptoms in the spring. 2022 skin testing showed: Positive to dust mites,cat, and tree pollen. Borderline to dog. ?Interim history - controlled, increased symptoms now.   ?Continue environmental control measures as below. ?Take zyrtec 10mg  daily in the spring months (from March through May) ?Use Flonase (fluticasone) nasal spray 1 spray per nostril twice a day as needed for nasal congestion.  ?Use olopatadine eye drops 0.2% once a day as needed for itchy/watery eyes. ? ?Return in about 1 year (around 08/10/2022). ? ?No orders of the defined types were placed in this encounter. ? ?Lab Orders  ?No laboratory test(s) ordered today  ? ? ?Diagnostics: ?None. ? ?Medication List:  ?Current Outpatient Medications  ?Medication Sig Dispense Refill  ? ALPRAZolam (XANAX XR) 0.5 MG 24 hr tablet 1 tab po q day prn anxiety before flight 10 tablet 0  ? amLODipine (NORVASC) 5 MG tablet Take 1 tablet (5 mg total) by mouth daily. 90 tablet 1  ? fluticasone (FLONASE) 50 MCG/ACT nasal spray Place into both nostrils as needed.    ? naproxen sodium (ALEVE) 220 MG tablet Take 220 mg by mouth as needed.    ? olopatadine (PATANOL) 0.1 % ophthalmic solution Place 1 drop into both eyes daily as needed for allergies.    ? cetirizine (ZYRTEC) 10 MG tablet TAKE 1 TABLET (10  MG TOTAL) BY MOUTH IN THE MORNING AND AT BEDTIME. (Patient not taking: Reported on 08/09/2021) 180 tablet 1  ? famotidine (PEPCID) 20 MG tablet Take 1 tablet (20 mg total) by mouth 2 (two) times daily. (Patient not taking: Reported on 08/09/2021) 60 tablet 3  ? ?No current facility-administered medications for this visit.  ? ?Allergies: ?Allergies  ?Allergen Reactions  ? Propranolol  Hives and Rash  ? ?I reviewed her past medical history, social history, family history, and environmental history and no significant changes have been reported from her previous visit. ? ?Review of Systems  ?Constitutional:  Negative for appetite change, chills, fever and unexpected weight change.  ?HENT:  Negative for congestion and rhinorrhea.   ?Eyes:  Positive for itching.  ?Respiratory:  Negative for cough, chest tightness, shortness of breath and wheezing.   ?Cardiovascular:  Negative for chest pain.  ?Gastrointestinal:  Negative for abdominal pain.  ?Genitourinary:  Negative for difficulty urinating.  ?Skin:  Positive for rash.  ?Allergic/Immunologic: Positive for environmental allergies.  ?Neurological:  Negative for headaches.  ? ?Objective: ?BP 126/80   Pulse 78   Temp 98.7 ?F (37.1 ?C)   Resp 18   Ht 5\' 4"  (1.626 m)   Wt 118 lb 12.8 oz (53.9 kg)   SpO2 100%   BMI 20.39 kg/m?  ?Body mass index is 20.39 kg/m? ?Physical Exam ?Vitals and nursing note reviewed.  ?Constitutional:   ?   Appearance: Normal appearance. She is well-developed.  ?HENT:  ?   Head: Normocephalic and atraumatic.  ?   Right Ear: Tympanic membrane and external ear normal.  ?   Left Ear: Tympanic membrane and external ear normal.  ?   Nose: Nose normal.  ?   Mouth/Throat:  ?   Mouth: Mucous membranes are moist.  ?   Pharynx: Oropharynx is clear.  ?Eyes:  ?   Conjunctiva/sclera: Conjunctivae normal.  ?Cardiovascular:  ?   Rate and Rhythm: Normal rate and regular rhythm.  ?   Heart sounds: Normal heart sounds. No murmur heard. ?  No friction rub. No gallop.  ?Pulmonary:  ?   Effort: Pulmonary effort is normal.  ?   Breath sounds: Normal breath sounds. No wheezing, rhonchi or rales.  ?Abdominal:  ?   Palpations: Abdomen is soft.  ?Musculoskeletal:  ?   Cervical back: Neck supple.  ?Skin: ?   General: Skin is warm.  ?   Findings: No rash.  ?   Comments: Erythematous hue on right side of the neck.  ?Neurological:  ?   Mental Status: She  is alert and oriented to person, place, and time.  ?Psychiatric:     ?   Behavior: Behavior normal.  ?Previous notes and tests were reviewed. ?The plan was reviewed with the patient/family, and all questions/concerned were addressed. ? ?It was my pleasure to see Kiernan today and participate in her care. Please feel free to contact me with any questions or concerns. ? ?Sincerely, ? ?Elease Hashimoto, DO ?Allergy & Immunology ? ?[facility] ?[place] office: [phone] ?[place] office: [phone] ?

## 2021-08-09 NOTE — Assessment & Plan Note (Signed)
Past history - Mild rhino conjunctivitis symptoms in the spring. 2022 skin testing showed: Positive to dust mites,cat, and tree pollen. Borderline to dog. ?Interim history - controlled, increased symptoms now.   ?? Continue environmental control measures as below. ?? Take zyrtec 10mg  daily in the spring months (from March through May) ?? Use Flonase (fluticasone) nasal spray 1 spray per nostril twice a day as needed for nasal congestion.  ?? Use olopatadine eye drops 0.2% once a day as needed for itchy/watery eyes. ?

## 2021-08-09 NOTE — Assessment & Plan Note (Signed)
Past history - Pruritic rash with lip angioedema outbreak for the past 10+ years occurring about once per year for a few months. No triggers noted. Tried benadryl and steroids with good benefit. Following with rheumatology for positive ANA and Raynaud's.  ?Interim history - no hives until last week on her neck, lasted for a few days. Did not take antihistamines. 2022 bloodwork unremarkable. No swelling episodes.  ?? Keep track of episodes and take pictures. ?? At the first onset of symptoms:  ?? Start zyrtec (cetirizine) 10mg  twice a day. ?? If symptoms are not controlled or causes drowsiness let know. ?? Start famotidine 20mg  twice a day.  ?? Avoid the following potential triggers: alcohol, tight clothing, NSAIDs, hot showers and getting overheated. ?

## 2021-09-11 ENCOUNTER — Ambulatory Visit
Admission: RE | Admit: 2021-09-11 | Discharge: 2021-09-11 | Disposition: A | Discharge status: Home / Self Care | Payer: BC Managed Care – PPO | Source: Ambulatory Visit | Attending: Obstetrics and Gynecology | Admitting: Obstetrics and Gynecology

## 2021-09-11 DIAGNOSIS — N6310 Unspecified lump in the right breast, unspecified quadrant: Secondary | ICD-10-CM

## 2021-09-11 DIAGNOSIS — N6315 Unspecified lump in the right breast, overlapping quadrants: Secondary | ICD-10-CM

## 2021-09-11 DIAGNOSIS — R922 Inconclusive mammogram: Secondary | ICD-10-CM | POA: Diagnosis not present

## 2021-09-11 DIAGNOSIS — N6311 Unspecified lump in the right breast, upper outer quadrant: Secondary | ICD-10-CM | POA: Diagnosis not present

## 2021-09-18 ENCOUNTER — Other Ambulatory Visit: Payer: Self-pay | Admitting: Medical

## 2021-09-19 MED ORDER — ALPRAZOLAM ER 0.5 MG PO TB24: ORAL_TABLET | ORAL | 0 refills | Status: DC

## 2021-09-19 NOTE — Telephone Encounter (Addendum)
Requesting: alprazolam xr 0.5mg   ?Contract: None ?UDS: None ?Last Visit: 10/17/20 ?Next Visit: None ?Last Refill: 02/26/2021 #10 and 0RF ? ?Please Advise ? ?Rx refill sent to pharmacy. Pt used prior to air travel. ? ?Esperanza Richters, PA-C  ? ?

## 2021-10-17 ENCOUNTER — Other Ambulatory Visit: Payer: Self-pay | Admitting: *Deleted

## 2021-10-17 DIAGNOSIS — I7300 Raynaud's syndrome without gangrene: Secondary | ICD-10-CM

## 2021-10-17 MED ORDER — AMLODIPINE BESYLATE 5 MG PO TABS: 5.0000 mg | ORAL_TABLET | Freq: Every day | ORAL | 1 refills | Status: DC

## 2021-10-17 NOTE — Telephone Encounter (Signed)
Refill request received via fax for [facility].  ? ?Next Visit: 02/06/2022 ? ?Last Visit: 08/01/2021 ? ?Dx: Raynaud's syndrome without gangrene  ? ?Current Dose per office note on 08/01/2021:  ?amLODipine (NORVASC) 5 MG tablet   ?    Sig: Take 1 tablet (5 mg total) by mouth daily  ? ? ?Okay to refill Amlodipine?   ?

## 2021-12-17 ENCOUNTER — Ambulatory Visit (INDEPENDENT_AMBULATORY_CARE_PROVIDER_SITE_OTHER): Payer: BC Managed Care – PPO | Admitting: Medical

## 2021-12-17 VITALS — BP 122/54 | HR 64 | Temp 98.6°F | Resp 18 | Ht 64.0 in | Wt 119.4 lb

## 2021-12-17 DIAGNOSIS — J302 Other seasonal allergic rhinitis: Secondary | ICD-10-CM | POA: Diagnosis not present

## 2021-12-17 DIAGNOSIS — H1010 Acute atopic conjunctivitis, unspecified eye: Secondary | ICD-10-CM

## 2021-12-17 DIAGNOSIS — I7300 Raynaud's syndrome without gangrene: Secondary | ICD-10-CM | POA: Diagnosis not present

## 2021-12-17 DIAGNOSIS — F419 Anxiety disorder, unspecified: Secondary | ICD-10-CM

## 2021-12-17 DIAGNOSIS — Z0000 Encounter for general adult medical examination without abnormal findings: Secondary | ICD-10-CM | POA: Diagnosis not present

## 2021-12-17 DIAGNOSIS — J3089 Other allergic rhinitis: Secondary | ICD-10-CM

## 2021-12-17 LAB — CBC WITH DIFFERENTIAL/PLATELET
Basophils Absolute: 0.1 10*3/uL (ref 0.0–0.1)
Basophils Relative: 1.1 % (ref 0.0–3.0)
Eosinophils Absolute: 0 10*3/uL (ref 0.0–0.7)
Eosinophils Relative: 0.5 % (ref 0.0–5.0)
HCT: 39.6 % (ref 36.0–46.0)
Hemoglobin: 13.1 g/dL (ref 12.0–15.0)
Lymphocytes Relative: 29.1 % (ref 12.0–46.0)
Lymphs Abs: 1.4 10*3/uL (ref 0.7–4.0)
MCHC: 33.2 g/dL (ref 30.0–36.0)
MCV: 88.6 fl (ref 78.0–100.0)
Monocytes Absolute: 0.4 10*3/uL (ref 0.1–1.0)
Monocytes Relative: 9 % (ref 3.0–12.0)
Neutro Abs: 2.8 10*3/uL (ref 1.4–7.7)
Neutrophils Relative %: 60.3 % (ref 43.0–77.0)
Platelets: 246 10*3/uL (ref 150.0–400.0)
RBC: 4.47 Mil/uL (ref 3.87–5.11)
RDW: 12.9 % (ref 11.5–15.5)
WBC: 4.7 10*3/uL (ref 4.0–10.5)

## 2021-12-17 LAB — COMPREHENSIVE METABOLIC PANEL
ALT: 14 U/L (ref 0–35)
AST: 20 U/L (ref 0–37)
Albumin: 4.7 g/dL (ref 3.5–5.2)
Alkaline Phosphatase: 45 U/L (ref 39–117)
BUN: 9 mg/dL (ref 6–23)
CO2: 26 mEq/L (ref 19–32)
Calcium: 9.5 mg/dL (ref 8.4–10.5)
Chloride: 102 mEq/L (ref 96–112)
Creatinine, Ser: 0.68 mg/dL (ref 0.40–1.20)
GFR: 110.29 mL/min (ref >60.00)
Glucose, Bld: 84 mg/dL (ref 70–99)
Potassium: 4.5 mEq/L (ref 3.5–5.1)
Sodium: 138 mEq/L (ref 135–145)
Total Bilirubin: 0.5 mg/dL (ref 0.2–1.2)
Total Protein: 7.1 g/dL (ref 6.0–8.3)

## 2021-12-17 LAB — LIPID PANEL
Cholesterol: 181 mg/dL (ref 0–200)
HDL: 66.2 mg/dL (ref >39.00)
LDL Cholesterol: 106 mg/dL — ABNORMAL HIGH (ref 0–99)
NonHDL: 115.27
Total CHOL/HDL Ratio: 3
Triglycerides: 48 mg/dL (ref 0.0–149.0)
VLDL: 9.6 mg/dL (ref 0.0–40.0)

## 2021-12-17 NOTE — Progress Notes (Signed)
Subjective:    Patient ID: Kelly Houston, female    DOB: 14-Nov-1980, 41 y.o.   MRN: [id number]  HPI   Pt exercises 5-6  times a week(walks, runs and weights). Pt states eats healthy. Non smoker. Social sporadic alcohol use.   Up to date on vaccines.  Last year had pap normal.   Raynauds- pt is on amlodipine for this.  Pt has anxiety. She only takes prior to flying 10 tab will last about 3 months.  Pt also has seasonal allergies. Controlled with zyrtec, flonase and occasionl patanol.     Review of Systems  Constitutional:  Negative for chills, fatigue and fever.  HENT:  Negative for congestion, drooling and ear pain.   Respiratory:  Negative for cough, chest tightness, shortness of breath and wheezing.   Cardiovascular:  Negative for chest pain and palpitations.  Gastrointestinal:  Negative for abdominal pain.  Genitourinary:  Negative for dysuria, flank pain and frequency.  Musculoskeletal:  Negative for back pain.  Skin:  Negative for rash.  Neurological:  Negative for dizziness, facial asymmetry, speech difficulty, weakness and light-headedness.  Hematological:  Negative for adenopathy. Does not bruise/bleed easily.  Psychiatric/Behavioral:  Negative for behavioral problems, decreased concentration and hallucinations. The patient is nervous/anxious.        Only with flying.    Past Medical History:  Diagnosis Date   Allergy    Angio-edema    Anxiety    when she travels by air.   Migraines    since age 84   Urticaria    Vertigo    Per patient     Social History   Socioeconomic History   Marital status: Single    Spouse name: Not on file   Number of children: Not on file   Years of education: Not on file   Highest education level: Not on file  Occupational History   Not on file  Tobacco Use   Smoking status: Never    Passive exposure: Never   Smokeless tobacco: Never  Vaping Use   Vaping Use: Never used  Substance and Sexual Activity    Alcohol use: Yes    Comment: socially   Drug use: Never   Sexual activity: Never    Birth control/protection: None  Other Topics Concern   Not on file  Social History Narrative   Not on file   Social Determinants of Health   Financial Resource Strain: Not on file  Food Insecurity: Not on file  Transportation Needs: Not on file  Physical Activity: Not on file  Stress: Not on file  Social Connections: Not on file  Intimate Partner Violence: Not on file    Past Surgical History:  Procedure Laterality Date   rt wrist cyst removal. ganglion.      Family History  Problem Relation Age of Onset   Hypertension Mother    High Cholesterol Father    Urticaria Sister    Angioedema Sister    Depression Brother    Thyroid disease Brother    Lupus Maternal Grandmother     Allergies  Allergen Reactions   Propranolol Hives and Rash    Current Outpatient Medications on File Prior to Visit  Medication Sig Dispense Refill   ALPRAZolam (XANAX XR) 0.5 MG 24 hr tablet 1 tab po q day prn anxiety before flight 10 tablet 0   amLODipine (NORVASC) 5 MG tablet Take 1 tablet (5 mg total) by mouth daily. 90 tablet 1   cetirizine (ZYRTEC)  10 MG tablet TAKE 1 TABLET (10 MG TOTAL) BY MOUTH IN THE MORNING AND AT BEDTIME. (Patient not taking: Reported on 08/09/2021) 180 tablet 1   famotidine (PEPCID) 20 MG tablet Take 1 tablet (20 mg total) by mouth 2 (two) times daily. (Patient not taking: Reported on 08/09/2021) 60 tablet 3   fluticasone (FLONASE) 50 MCG/ACT nasal spray Place into both nostrils as needed.     naproxen sodium (ALEVE) 220 MG tablet Take 220 mg by mouth as needed.     olopatadine (PATANOL) 0.1 % ophthalmic solution Place 1 drop into both eyes daily as needed for allergies.     No current facility-administered medications on file prior to visit.    BP (!) 122/54   Pulse 64   Temp 98.6 F (37 C)   Resp 18   Ht 5\' 4"  (1.626 m)   Wt 119 lb 6.4 oz (54.2 kg)   SpO2 100%   BMI 20.49  kg/m        Objective:   Physical Exam  General Mental Status- Alert. General Appearance- Not in acute distress.   Skin General: Color- Normal Color. Moisture- Normal Moisture.  Neck Carotid Arteries- Normal color. Moisture- Normal Moisture. No carotid bruits. No JVD.  Chest and Lung Exam Auscultation: Breath Sounds:-Normal.  Cardiovascular Auscultation:Rythm- Regular. Murmurs & Other Heart Sounds:Auscultation of the heart reveals- No Murmurs.  Abdomen Inspection:-Inspeection Normal. Palpation/Percussion:Note:No mass. Palpation and Percussion of the abdomen reveal- Non Tender, Non Distended + BS, no rebound or guarding.   Neurologic Cranial Nerve exam:- CN III-XII intact(No nystagmus), symmetric smile. Strength:- 5/5 equal and symmetric strength both upper and lower extremities.         Assessment & Plan:   Patient Instructions  For you wellness exam today I have ordered cbc, cmp and lipid panel.  Vaccine up to date.  Recommend exercise and healthy diet.  We will let you know lab results as they come in.  Follow up date appointment will be determined after lab review.    Allergic rhinitis- controlled with med regimen let me know when you need refill.  Raynauds- continue amlodpine.  For anxiety with flying will refill your xanax as needed. Give me update 3 days prior to refill. Presently since not using frequently not requiring controlled med contract and UDS.            , Esperanza Richters   [place] charge as did address anxiety with flying and allergies. Discussed/explained with pt how would proceed with anxiety medication and scenarios that would require contract and uds.

## 2021-12-17 NOTE — Addendum Note (Signed)
Addended by: Gwenevere Abbot on: 12/17/2021 10:14 AM   Modules accepted: Orders

## 2021-12-17 NOTE — Patient Instructions (Addendum)
For you wellness exam today I have ordered cbc, cmp and lipid panel.  Vaccine up to date.  Recommend exercise and healthy diet.  We will let you know lab results as they come in.  Follow up date appointment will be determined after lab review.    Allergic rhinitis- controlled with med regimen let me know when you need refill.  Raynauds- continue amlodpine.  For anxiety with flying will refill your xanax as needed. Give me update 3 days prior to refill. Presently since not using frequently not requiring controlled med contract and UDS.  Preventive Care 49-51 Years Old, Female Preventive care refers to lifestyle choices and visits with your health care provider that can promote health and wellness. Preventive care visits are also called wellness exams. What can I expect for my preventive care visit? Counseling During your preventive care visit, your health care provider may ask about your: Medical history, including: Past medical problems. Family medical history. Pregnancy history. Current health, including: Menstrual cycle. Method of birth control. Emotional well-being. Home life and relationship well-being. Sexual activity and sexual health. Lifestyle, including: Alcohol, nicotine or tobacco, and drug use. Access to firearms. Diet, exercise, and sleep habits. Work and work Statistician. Sunscreen use. Safety issues such as seatbelt and bike helmet use. Physical exam Your health care provider may check your: Height and weight. These may be used to calculate your BMI (body mass index). BMI is a measurement that tells if you are at a healthy weight. Waist circumference. This measures the distance around your waistline. This measurement also tells if you are at a healthy weight and may help predict your risk of certain diseases, such as type 2 diabetes and high blood pressure. Heart rate and blood pressure. Body temperature. Skin for abnormal spots. What immunizations do I  need?  Vaccines are usually given at various ages, according to a schedule. Your health care provider will recommend vaccines for you based on your age, medical history, and lifestyle or other factors, such as travel or where you work. What tests do I need? Screening Your health care provider may recommend screening tests for certain conditions. This may include: Pelvic exam and Pap test. Lipid and cholesterol levels. Diabetes screening. This is done by checking your blood sugar (glucose) after you have not eaten for a while (fasting). Hepatitis B test. Hepatitis C test. HIV (human immunodeficiency virus) test. STI (sexually transmitted infection) testing, if you are at risk. BRCA-related cancer screening. This may be done if you have a family history of breast, ovarian, tubal, or peritoneal cancers. Talk with your health care provider about your test results, treatment options, and if necessary, the need for more tests. Follow these instructions at home: Eating and drinking  Eat a healthy diet that includes fresh fruits and vegetables, whole grains, lean protein, and low-fat dairy products. Take vitamin and mineral supplements as recommended by your health care provider. Do not drink alcohol if: Your health care provider tells you not to drink. You are pregnant, may be pregnant, or are planning to become pregnant. If you drink alcohol: Limit how much you have to 0-1 drink a day. Know how much alcohol is in your drink. In the [place] one drink equals one 12 oz bottle of beer (355 mL), one 5 oz glass of wine (148 mL), or one 1 oz glass of hard liquor (44 mL). Lifestyle Brush your teeth every morning and night with fluoride toothpaste. Floss one time each day. Exercise for at least 30 minutes  5 or more days each week. Do not use any products that contain nicotine or tobacco. These products include cigarettes, chewing tobacco, and vaping devices, such as e-cigarettes. If you need help  quitting, ask your health care provider. Do not use drugs. If you are sexually active, practice safe sex. Use a condom or other form of protection to prevent STIs. If you do not wish to become pregnant, use a form of birth control. If you plan to become pregnant, see your health care provider for a prepregnancy visit. Find healthy ways to manage stress, such as: Meditation, yoga, or listening to music. Journaling. Talking to a trusted person. Spending time with friends and family. Minimize exposure to UV radiation to reduce your risk of skin cancer. Safety Always wear your seat belt while driving or riding in a vehicle. Do not drive: If you have been drinking alcohol. Do not ride with someone who has been drinking. If you have been using any mind-altering substances or drugs. While texting. When you are tired or distracted. Wear a helmet and other protective equipment during sports activities. If you have firearms in your house, make sure you follow all gun safety procedures. Seek help if you have been physically or sexually abused. What's next? Go to your health care provider once a year for an annual wellness visit. Ask your health care provider how often you should have your eyes and teeth checked. Stay up to date on all vaccines. This information is not intended to replace advice given to you by your health care provider. Make sure you discuss any questions you have with your health care provider. Document Revised: 11/15/2020 Document Reviewed: 11/15/2020 Elsevier Patient Education  [place].

## 2022-01-30 NOTE — Progress Notes (Deleted)
Office Visit Note  Patient: Kelly Houston             Date of Birth: 02-14-1981           MRN: [id number]             PCP: Marisue Brooklyn Referring: Marisue Brooklyn Visit Date: 02/06/2022   Subjective:  No chief complaint on file.   History of Present Illness: Kelly Houston is a 41 y.o. female here for follow up for raynaud's symptoms    Previous HPI 08/01/2021 Kelly Houston is a 41 y.o. female here for follow up for raynaud's symptoms after starting amlodipine 5 mg daily. She also continues having some generalized symptoms with positional tremor, urticarial rashes, and joint pains. She notices partial improvement in hand discoloration and numbness but continues to have very cold fingers and toes. She notices some small nodules on the left index finger mostly none at the tip of the finger. No leg swelling migraines or orthostatic symptoms after starting amlodipine.   Previous HPI 07/02/21 Kelly Houston is a 41 y.o. female here for follow up for positive ANA with joint pains, raynaud's symptoms, and urticarial rashes observing with stable symptoms about 6 months ago. She continues to have a positional tremor, worse since off propanolol which was causing rashes. Hands are ice cold all the time, better in bed worse during day worse if body gets cold. She has not developed any lesions or skin damage but it is frequent and severe enough to bother her a lot.    Previous HPI 11/13/20 Kelly Houston is a 41 y.o. female here for evaluation of positive ANA and joint pains. She has experienced some degree of symptoms for years. More recently she describes increases in numbness episodically affecting the left worse than right arms and in the left foot and great toe. She notices hand pain and stiffness increased with cold exposure. Sleeping on either side often provokes waking overnight or early morning with numbness down the left  arm. She has some pain and tightness in the low back limiting hip range of movement. She has had some previous autoimmune workup apparently, she reports what sounds like positive autoantibody tests in the past. She also reports a recent tick bite over the past weekend. She was hiking outdoors on Saturday and noticed a tick attached to her left flank Sunday. She did shower after outdoors exposure Saturday but does not recall any insect at that time. Currently small erythematous rash present without any associated pain or sensation.     No Rheumatology ROS completed.   PMFS History:  Patient Active Problem List   Diagnosis Date Noted   Seasonal and perennial allergic rhinoconjunctivitis 08/09/2021   Urticaria 01/08/2021   Raynaud's syndrome without gangrene 12/10/2020   Positive ANA (antinuclear antibody) 11/13/2020   Migraines 10/31/1989    Past Medical History:  Diagnosis Date   Allergy    Angio-edema    Anxiety    when she travels by air.   Migraines    since age 52   Urticaria    Vertigo    Per patient    Family History  Problem Relation Age of Onset   Hypertension Mother    High Cholesterol Father    Urticaria Sister    Angioedema Sister    Depression Brother    Thyroid disease Brother    Lupus Maternal Grandmother    Past Surgical History:  Procedure Laterality Date  rt wrist cyst removal. ganglion.     Social History   Social History Narrative   Not on file   Immunization History  Administered Date(s) Administered   Comptroller (J&J) SARS-COV-2 Vaccination 09/07/2019   PFIZER SARS-COV-2 Pediatric Vaccination 5-42yrs 03/26/2020   Tdap 08/04/2012     Objective: Vital Signs: There were no vitals taken for this visit.   Physical Exam   Musculoskeletal Exam: ***  CDAI Exam: CDAI Score: -- Patient Global: --; Provider Global: -- Swollen: --; Tender: -- Joint Exam 02/06/2022   No joint exam has been documented for this visit   There is currently no  information documented on the homunculus. Go to the Rheumatology activity and complete the homunculus joint exam.  Investigation: No additional findings.  Imaging: No results found.  Recent Labs: Lab Results  Component Value Date   WBC 4.7 12/17/2021   HGB 13.1 12/17/2021   PLT 246.0 12/17/2021   NA 138 12/17/2021   K 4.5 12/17/2021   CL 102 12/17/2021   CO2 26 12/17/2021   GLUCOSE 84 12/17/2021   BUN 9 12/17/2021   CREATININE 0.68 12/17/2021   BILITOT 0.5 12/17/2021   ALKPHOS 45 12/17/2021   AST 20 12/17/2021   ALT 14 12/17/2021   PROT 7.1 12/17/2021   ALBUMIN 4.7 12/17/2021   CALCIUM 9.5 12/17/2021    Speciality Comments: No specialty comments available.  Procedures:  No procedures performed Allergies: Propranolol   Assessment / Plan:     Visit Diagnoses: No diagnosis found.  ***  Orders: No orders of the defined types were placed in this encounter.  No orders of the defined types were placed in this encounter.    Follow-Up Instructions: No follow-ups on file.   Jairo Ben, RT  Note - This record has been created using [employer].  Chart creation errors have been sought, but may not always  have been located. Such creation errors do not reflect on  the standard of medical care.

## 2022-02-06 ENCOUNTER — Ambulatory Visit: Payer: BC Managed Care – PPO | Admitting: Internal Medicine

## 2022-02-06 DIAGNOSIS — L509 Urticaria, unspecified: Secondary | ICD-10-CM

## 2022-02-06 DIAGNOSIS — R768 Other specified abnormal immunological findings in serum: Secondary | ICD-10-CM

## 2022-02-06 DIAGNOSIS — I7300 Raynaud's syndrome without gangrene: Secondary | ICD-10-CM

## 2022-02-14 NOTE — Progress Notes (Unsigned)
Office Visit Note  Patient: Kelly Houston             Date of Birth: 1980/07/21           MRN: [id number]             PCP: Elise Benne Referring: Elise Benne Visit Date: 02/26/2022   Subjective:  Follow-up (Raynaud's symptoms, cold hands. )   History of Present Illness: Kelly Houston is a 41 y.o. female here for follow up for raynaud's symptoms on amlodipine 5 mg daily. She has positive ANA with very low positive Scl-70 Abs and symptoms of joint pains, urticarial rashes, positional tremor, and sometimes areas of numbness. Her symptoms have been doing fairly well and tolerating the amlodipine okay. She does see some peripheral edema in lower extremities worst at the end of the day this will cause some pain relieved taking off shoes. Raynaud's symptoms are a bit worse after a trip to [place] and [place] and with the weather change. No new lesions, pitting, or chronic skin changes. Other symptoms remain overall stable.  Previous HPI 08/01/2021 Kelly Houston is a 41 y.o. female here for follow up for raynaud's symptoms after starting amlodipine 5 mg daily. She also continues having some generalized symptoms with positional tremor, urticarial rashes, and joint pains. She notices partial improvement in hand discoloration and numbness but continues to have very cold fingers and toes. She notices some small nodules on the left index finger mostly none at the tip of the finger. No leg swelling migraines or orthostatic symptoms after starting amlodipine.   Previous HPI 07/02/21 Kelly Houston is a 41 y.o. female here for follow up for positive ANA with joint pains, raynaud's symptoms, and urticarial rashes observing with stable symptoms about 6 months ago. She continues to have a positional tremor, worse since off propanolol which was causing rashes. Hands are ice cold all the time, better in bed worse during day worse if body  gets cold. She has not developed any lesions or skin damage but it is frequent and severe enough to bother her a lot.    Previous HPI 11/13/20 Kelly Houston is a 41 y.o. female here for evaluation of positive ANA and joint pains. She has experienced some degree of symptoms for years. More recently she describes increases in numbness episodically affecting the left worse than right arms and in the left foot and great toe. She notices hand pain and stiffness increased with cold exposure. Sleeping on either side often provokes waking overnight or early morning with numbness down the left arm. She has some pain and tightness in the low back limiting hip range of movement. She has had some previous autoimmune workup apparently, she reports what sounds like positive autoantibody tests in the past. She also reports a recent tick bite over the past weekend. She was hiking outdoors on Saturday and noticed a tick attached to her left flank Sunday. She did shower after outdoors exposure Saturday but does not recall any insect at that time. Currently small erythematous rash present without any associated pain or sensation.   Review of Systems  Constitutional:  Negative for fatigue.  HENT:  Negative for mouth sores and mouth dryness.   Eyes:  Positive for dryness.  Respiratory:  Negative for shortness of breath.   Cardiovascular:  Negative for chest pain and palpitations.  Gastrointestinal:  Negative for blood in stool, constipation and diarrhea.  Endocrine: Negative for increased urination.  Genitourinary:  Negative for involuntary urination.  Musculoskeletal:  Positive for joint pain and joint pain. Negative for gait problem, joint swelling, myalgias, muscle weakness, muscle tenderness and myalgias.  Skin:  Negative for color change, rash, hair loss and sensitivity to sunlight.  Allergic/Immunologic: Negative for susceptible to infections.  Neurological:  Positive for dizziness and headaches.   Hematological:  Negative for swollen glands.  Psychiatric/Behavioral:  Negative for depressed mood and sleep disturbance. The patient is not nervous/anxious.     PMFS History:  Patient Active Problem List   Diagnosis Date Noted   Seasonal and perennial allergic rhinoconjunctivitis 08/09/2021   Urticaria 01/08/2021   Raynaud's syndrome without gangrene 12/10/2020   Positive ANA (antinuclear antibody) 11/13/2020   Migraines 10/31/1989    Past Medical History:  Diagnosis Date   Allergy    Angio-edema    Anxiety    when she travels by air.   Migraines    since age 71   Urticaria    Vertigo    Per patient    Family History  Problem Relation Age of Onset   Hypertension Mother    High Cholesterol Father    Urticaria Sister    Angioedema Sister    Depression Brother    Thyroid disease Brother    Lupus Maternal Grandmother    Past Surgical History:  Procedure Laterality Date   rt wrist cyst removal. ganglion.     Social History   Social History Narrative   Not on file   Immunization History  Administered Date(s) Administered   Comptroller (J&J) SARS-COV-2 Vaccination 09/07/2019   PFIZER SARS-COV-2 Pediatric Vaccination 5-37yrs 03/26/2020   Tdap 08/04/2012     Objective: Vital Signs: BP 127/72 (BP Location: Left Arm, Patient Position: Sitting, Cuff Size: Normal)   Pulse 76   Resp 14   Ht 5\' 4"  (1.626 m)   Wt 121 lb 3.2 oz (55 kg)   LMP 02/23/2022 (Exact Date)   BMI 20.80 kg/m    Physical Exam Eyes:     Conjunctiva/sclera: Conjunctivae normal.  Cardiovascular:     Rate and Rhythm: Normal rate and regular rhythm.  Pulmonary:     Effort: Pulmonary effort is normal.     Breath sounds: Normal breath sounds.  Musculoskeletal:     Right lower leg: No edema.     Left lower leg: No edema.  Skin:    General: Skin is warm and dry.     Comments: Hands are cool bilaterally, nonspecific nailfold capillary changes in both hands, no digital pitting or nail changes, normal  capillary refill  Neurological:     Mental Status: She is alert.     Comments: Fine positional tremor in both hands  Psychiatric:        Mood and Affect: Mood normal.      Musculoskeletal Exam:  Shoulders full ROM no tenderness or swelling Elbows full ROM no tenderness or swelling Wrists full ROM no tenderness or swelling Fingers full ROM no tenderness or swelling Knees full ROM no tenderness or swelling   Investigation: No additional findings.  Imaging: No results found.  Recent Labs: Lab Results  Component Value Date   WBC 4.7 12/17/2021   HGB 13.1 12/17/2021   PLT 246.0 12/17/2021   NA 138 12/17/2021   K 4.5 12/17/2021   CL 102 12/17/2021   CO2 26 12/17/2021   GLUCOSE 84 12/17/2021   BUN 9 12/17/2021   CREATININE 0.68 12/17/2021   BILITOT 0.5 12/17/2021   ALKPHOS 45  12/17/2021   AST 20 12/17/2021   ALT 14 12/17/2021   PROT 7.1 12/17/2021   ALBUMIN 4.7 12/17/2021   CALCIUM 9.5 12/17/2021    Speciality Comments: No specialty comments available.  Procedures:  No procedures performed Allergies: Propranolol   Assessment / Plan:     Visit Diagnoses: Raynaud's syndrome without gangrene  Symptoms remain active no evidence of tissue injury or chronic changes on exam.  She is having slight pedal edema probably with the amlodipine.  We will continue at the 5 mg daily dose and she is continue to focus on maintaining extremity and core body temperature.  I recommend to follow-up again this winter for repeat examination.  Positive ANA (antinuclear antibody) Urticaria  Symptoms otherwise stable no evidence of increased skin rash or inflammatory changes elsewhere.  We could consider repeat monitoring for serology but would not for at least several more months.  No specific indications for immunosuppressive medication treatment.  Orders: No orders of the defined types were placed in this encounter.  No orders of the defined types were placed in this  encounter.    Follow-Up Instructions: Return in about 4 months (around 06/28/2022) for Raynauds on [facility] f/u ~24mos.   Fuller Plan, MD  Note - This record has been created using [employer].  Chart creation errors have been sought, but may not always  have been located. Such creation errors do not reflect on  the standard of medical care.

## 2022-02-26 ENCOUNTER — Ambulatory Visit: Payer: BC Managed Care – PPO | Attending: Internal Medicine | Admitting: Internal Medicine

## 2022-02-26 ENCOUNTER — Encounter: Payer: Self-pay | Admitting: Internal Medicine

## 2022-02-26 VITALS — BP 127/72 | HR 76 | Resp 14 | Ht 64.0 in | Wt 121.2 lb

## 2022-02-26 DIAGNOSIS — L509 Urticaria, unspecified: Secondary | ICD-10-CM

## 2022-02-26 DIAGNOSIS — I7300 Raynaud's syndrome without gangrene: Secondary | ICD-10-CM | POA: Diagnosis not present

## 2022-02-26 DIAGNOSIS — R768 Other specified abnormal immunological findings in serum: Secondary | ICD-10-CM

## 2022-03-15 ENCOUNTER — Other Ambulatory Visit: Payer: Self-pay | Admitting: Medical

## 2022-03-18 NOTE — Telephone Encounter (Signed)
Patient is requesting a refill of the following medications: Requested Prescriptions   Pending Prescriptions Disp Refills   ALPRAZolam (XANAX XR) 0.5 MG 24 hr tablet 10 tablet 0    Sig: 1 tab po q day prn anxiety before flight   Contract: none UDS: none Last Visit: 12/13/21 Next Visit: none Last Refill: 09/19/21  Please Advise  10 tab rx. Uses before flying. On review last rx lasted 6 months. So not requiring contract. Refill sent.  Mackie Pai, PA-C

## 2022-03-19 ENCOUNTER — Other Ambulatory Visit: Payer: Self-pay

## 2022-03-19 ENCOUNTER — Encounter: Payer: Self-pay | Admitting: Medical

## 2022-03-19 ENCOUNTER — Encounter: Payer: Self-pay | Admitting: Internal Medicine

## 2022-03-19 DIAGNOSIS — I7300 Raynaud's syndrome without gangrene: Secondary | ICD-10-CM

## 2022-03-19 MED ORDER — ALPRAZOLAM ER 0.5 MG PO TB24
ORAL_TABLET | ORAL | 0 refills | Status: DC
Start: 2022-03-19 — End: 2022-12-20

## 2022-03-19 NOTE — Telephone Encounter (Signed)
Next Visit: 06/25/2022  Last Visit: 02/26/2022  Last Fill: 10/17/2021  Dx:  Raynaud's syndrome without gangrene  Current Dose per office note on 02/26/2022: amlodipine 5 mg daily dose   Okay to refill amlodipine?

## 2022-03-20 MED ORDER — AMLODIPINE BESYLATE 5 MG PO TABS: 5.0000 mg | ORAL_TABLET | Freq: Every day | ORAL | 0 refills | Status: DC

## 2022-06-21 ENCOUNTER — Other Ambulatory Visit: Payer: Self-pay | Admitting: Internal Medicine

## 2022-06-21 DIAGNOSIS — I7300 Raynaud's syndrome without gangrene: Secondary | ICD-10-CM

## 2022-06-21 NOTE — Telephone Encounter (Signed)
Next Visit: 07/15/2022   Last Visit: 02/26/2022   Last Fill: 03/20/2022   Dx:  Raynaud's syndrome without gangrene   Current Dose per office note on 02/26/2022: amlodipine 5 mg daily dose    Okay to refill amlodipine?

## 2022-06-25 ENCOUNTER — Ambulatory Visit: Payer: BC Managed Care – PPO | Admitting: Internal Medicine

## 2022-07-14 NOTE — Progress Notes (Unsigned)
Office Visit Note  Patient: Kelly Houston             Date of Birth: [birth year]           MRN: [id number]             PCP: Elise Benne Referring: Elise Benne Visit Date: 07/15/2022   Subjective:  No chief complaint on file.   History of Present Illness: Dae Urioste is a 42 y.o. female here for follow up ***   Previous HPI 02/26/22 Shikera Hodapp is a 42 y.o. female here for follow up for raynaud's symptoms on amlodipine 5 mg daily. She has positive ANA with very low positive Scl-70 Abs and symptoms of joint pains, urticarial rashes, positional tremor, and sometimes areas of numbness. Her symptoms have been doing fairly well and tolerating the amlodipine okay. She does see some peripheral edema in lower extremities worst at the end of the day this will cause some pain relieved taking off shoes. Raynaud's symptoms are a bit worse after a trip to [place] and with the weather change. No new lesions, pitting, or chronic skin changes. Other symptoms remain overall stable.   Previous HPI 08/01/2021 Kaneshia Fornshell is a 42 y.o. female here for follow up for raynaud's symptoms after starting amlodipine 5 mg daily. She also continues having some generalized symptoms with positional tremor, urticarial rashes, and joint pains. She notices partial improvement in hand discoloration and numbness but continues to have very cold fingers and toes. She notices some small nodules on the left index finger mostly none at the tip of the finger. No leg swelling migraines or orthostatic symptoms after starting amlodipine.   Previous HPI 07/02/21 Gailyn Monzo is a 41 y.o. female here for follow up for positive ANA with joint pains, raynaud's symptoms, and urticarial rashes observing with stable symptoms about 6 months ago. She continues to have a positional tremor, worse since off propanolol which was causing rashes.  Hands are ice cold all the time, better in bed worse during day worse if body gets cold. She has not developed any lesions or skin damage but it is frequent and severe enough to bother her a lot.    Previous HPI 11/13/20 Jamin Dobrowski is a 42 y.o. female here for evaluation of positive ANA and joint pains. She has experienced some degree of symptoms for years. More recently she describes increases in numbness episodically affecting the left worse than right arms and in the left foot and great toe. She notices hand pain and stiffness increased with cold exposure. Sleeping on either side often provokes waking overnight or early morning with numbness down the left arm. She has some pain and tightness in the low back limiting hip range of movement. She has had some previous autoimmune workup apparently, she reports what sounds like positive autoantibody tests in the past. She also reports a recent tick bite over the past weekend. She was hiking outdoors on Saturday and noticed a tick attached to her left flank Sunday. She did shower after outdoors exposure Saturday but does not recall any insect at that time. Currently small erythematous rash present without any associated pain or sensation.   No Rheumatology ROS completed.   PMFS History:  Patient Active Problem List   Diagnosis Date Noted   Seasonal and perennial allergic rhinoconjunctivitis 08/09/2021   Urticaria 01/08/2021   Raynaud's syndrome without gangrene 12/10/2020   Positive ANA (antinuclear antibody) 11/13/2020  Migraines 10/31/1989    Past Medical History:  Diagnosis Date   Allergy    Angio-edema    Anxiety    when she travels by air.   Migraines    since age 53   Urticaria    Vertigo    Per patient    Family History  Problem Relation Age of Onset   Hypertension Mother    High Cholesterol Father    Urticaria Sister    Angioedema Sister    Depression Brother    Thyroid disease Brother    Lupus Maternal  Grandmother    Past Surgical History:  Procedure Laterality Date   rt wrist cyst removal. ganglion.     Social History   Social History Narrative   Not on file   Immunization History  Administered Date(s) Administered   Engineer, maintenance (J&J) SARS-COV-2 Vaccination 09/07/2019   PFIZER SARS-COV-2 Pediatric Vaccination 5-36yr 03/26/2020   Tdap 08/04/2012     Objective: Vital Signs: There were no vitals taken for this visit.   Physical Exam   Musculoskeletal Exam: ***  CDAI Exam: CDAI Score: -- Patient Global: --; Provider Global: -- Swollen: --; Tender: -- Joint Exam 07/15/2022   No joint exam has been documented for this visit   There is currently no information documented on the homunculus. Go to the Rheumatology activity and complete the homunculus joint exam.  Investigation: No additional findings.  Imaging: No results found.  Recent Labs: Lab Results  Component Value Date   WBC 4.7 12/17/2021   HGB 13.1 12/17/2021   PLT 246.0 12/17/2021   NA 138 12/17/2021   K 4.5 12/17/2021   CL 102 12/17/2021   CO2 26 12/17/2021   GLUCOSE 84 12/17/2021   BUN 9 12/17/2021   CREATININE 0.68 12/17/2021   BILITOT 0.5 12/17/2021   ALKPHOS 45 12/17/2021   AST 20 12/17/2021   ALT 14 12/17/2021   PROT 7.1 12/17/2021   ALBUMIN 4.7 12/17/2021   CALCIUM 9.5 12/17/2021    Speciality Comments: No specialty comments available.  Procedures:  No procedures performed Allergies: Propranolol   Assessment / Plan:     Visit Diagnoses: No diagnosis found.  ***  Orders: No orders of the defined types were placed in this encounter.  No orders of the defined types were placed in this encounter.    Follow-Up Instructions: No follow-ups on file.   CCollier Salina MD  Note - This record has been created using [employer]  Chart creation errors have been sought, but may not always  have been located. Such creation errors do not reflect on  the standard of medical  care.

## 2022-07-15 ENCOUNTER — Ambulatory Visit: Payer: BC Managed Care – PPO | Attending: Internal Medicine | Admitting: Internal Medicine

## 2022-07-15 ENCOUNTER — Encounter: Payer: Self-pay | Admitting: Internal Medicine

## 2022-07-15 VITALS — BP 127/77 | HR 75 | Resp 12 | Ht 64.0 in | Wt 120.0 lb

## 2022-07-15 DIAGNOSIS — R768 Other specified abnormal immunological findings in serum: Secondary | ICD-10-CM | POA: Diagnosis not present

## 2022-07-15 DIAGNOSIS — R200 Anesthesia of skin: Secondary | ICD-10-CM | POA: Diagnosis not present

## 2022-07-15 DIAGNOSIS — I7300 Raynaud's syndrome without gangrene: Secondary | ICD-10-CM

## 2022-07-15 MED ORDER — AMLODIPINE BESYLATE 5 MG PO TABS: 5.0000 mg | ORAL_TABLET | Freq: Every day | ORAL | 3 refills | Status: DC

## 2022-07-25 ENCOUNTER — Other Ambulatory Visit: Payer: Self-pay | Admitting: Medical

## 2022-07-26 ENCOUNTER — Encounter: Payer: Self-pay | Admitting: Medical

## 2022-07-29 MED ORDER — ALPRAZOLAM ER 0.5 MG PO TB24: ORAL_TABLET | ORAL | 0 refills | Status: DC

## 2022-07-29 NOTE — Addendum Note (Signed)
Addended by: Anabel Halon on: 07/29/2022 05:37 PM   Modules accepted: Orders

## 2022-08-10 ENCOUNTER — Other Ambulatory Visit: Payer: Self-pay | Admitting: Internal Medicine

## 2022-08-10 DIAGNOSIS — I7300 Raynaud's syndrome without gangrene: Secondary | ICD-10-CM

## 2022-08-12 NOTE — Telephone Encounter (Signed)
Next Visit: 07/14/2023  Last Visit: 07/15/2022  Last Fill: 07/15/2022  Dx: Raynaud's syndrome without gangrene   Current Dose per office note on 07/15/2022: adequate benefit on the 5 mg amlodipine   Okay to refill Amlodipine?

## 2022-10-03 DIAGNOSIS — L2089 Other atopic dermatitis: Secondary | ICD-10-CM | POA: Diagnosis not present

## 2022-10-03 DIAGNOSIS — Q809 Congenital ichthyosis, unspecified: Secondary | ICD-10-CM | POA: Diagnosis not present

## 2022-12-20 ENCOUNTER — Ambulatory Visit (INDEPENDENT_AMBULATORY_CARE_PROVIDER_SITE_OTHER): Payer: BC Managed Care – PPO | Admitting: Medical

## 2022-12-20 VITALS — BP 126/62 | HR 100 | Temp 98.0°F | Resp 16 | Ht 64.0 in | Wt 118.6 lb

## 2022-12-20 DIAGNOSIS — R5383 Other fatigue: Secondary | ICD-10-CM | POA: Diagnosis not present

## 2022-12-20 DIAGNOSIS — Z23 Encounter for immunization: Secondary | ICD-10-CM | POA: Diagnosis not present

## 2022-12-20 DIAGNOSIS — E010 Iodine-deficiency related diffuse (endemic) goiter: Secondary | ICD-10-CM | POA: Diagnosis not present

## 2022-12-20 DIAGNOSIS — Z0000 Encounter for general adult medical examination without abnormal findings: Secondary | ICD-10-CM

## 2022-12-20 DIAGNOSIS — F419 Anxiety disorder, unspecified: Secondary | ICD-10-CM

## 2022-12-20 LAB — CBC WITH DIFFERENTIAL/PLATELET
Eosinophils Absolute: 41 cells/uL (ref 15–500)
Hemoglobin: 13.8 g/dL (ref 11.7–15.5)
MCV: 88.6 fL (ref 80.0–100.0)
Neutrophils Relative %: 65.8 %
Platelets: 283 10*3/uL (ref 140–400)
Total Lymphocyte: 25.2 %

## 2022-12-20 MED ORDER — ALPRAZOLAM ER 0.5 MG PO TB24: ORAL_TABLET | ORAL | 0 refills | Status: DC

## 2022-12-20 NOTE — Patient Instructions (Addendum)
For you wellness exam today I have ordered cbc, cmp and lipid panel.   Vaccine tdap today.   Recommend exercise and healthy diet.   We will let you know lab results as they come in.   Follow up date appointment will be determined after lab review.     Allergic rhinitis- controlled with med regimen let me know when you need refill.   Raynauds- continue amlodpine.   For anxiety with flying will refill your xanax as needed. Give 10 tab rx for flying anxiety. Presently since not using frequently not requiring controlled med contract and UDS.  Also for thyromegaly placed [place] order and for mild fatigue tsh and t4.   Preventive Care 27-52 Years Old, Female Preventive care refers to lifestyle choices and visits with your health care provider that can promote health and wellness. Preventive care visits are also called wellness exams. What can I expect for my preventive care visit? Counseling During your preventive care visit, your health care provider may ask about your: Medical history, including: Past medical problems. Family medical history. Pregnancy history. Current health, including: Menstrual cycle. Method of birth control. Emotional well-being. Home life and relationship well-being. Sexual activity and sexual health. Lifestyle, including: Alcohol, nicotine or tobacco, and drug use. Access to firearms. Diet, exercise, and sleep habits. Work and work Astronomer. Sunscreen use. Safety issues such as seatbelt and bike helmet use. Physical exam Your health care provider may check your: Height and weight. These may be used to calculate your BMI (body mass index). BMI is a measurement that tells if you are at a healthy weight. Waist circumference. This measures the distance around your waistline. This measurement also tells if you are at a healthy weight and may help predict your risk of certain diseases, such as type 2 diabetes and high blood pressure. Heart rate and blood  pressure. Body temperature. Skin for abnormal spots. What immunizations do I need?  Vaccines are usually given at various ages, according to a schedule. Your health care provider will recommend vaccines for you based on your age, medical history, and lifestyle or other factors, such as travel or where you work. What tests do I need? Screening Your health care provider may recommend screening tests for certain conditions. This may include: Pelvic exam and Pap test. Lipid and cholesterol levels. Diabetes screening. This is done by checking your blood sugar (glucose) after you have not eaten for a while (fasting). Hepatitis B test. Hepatitis C test. HIV (human immunodeficiency virus) test. STI (sexually transmitted infection) testing, if you are at risk. BRCA-related cancer screening. This may be done if you have a family history of breast, ovarian, tubal, or peritoneal cancers. Talk with your health care provider about your test results, treatment options, and if necessary, the need for more tests. Follow these instructions at home: Eating and drinking  Eat a healthy diet that includes fresh fruits and vegetables, whole grains, lean protein, and low-fat dairy products. Take vitamin and mineral supplements as recommended by your health care provider. Do not drink alcohol if: Your health care provider tells you not to drink. You are pregnant, may be pregnant, or are planning to become pregnant. If you drink alcohol: Limit how much you have to 0-1 drink a day. Know how much alcohol is in your drink. In the [place] one drink equals one 12 oz bottle of beer (355 mL), one 5 oz glass of wine (148 mL), or one 1 oz glass of hard liquor (44 mL). Lifestyle Brush  your teeth every morning and night with fluoride toothpaste. Floss one time each day. Exercise for at least 30 minutes 5 or more days each week. Do not use any products that contain nicotine or tobacco. These products include cigarettes,  chewing tobacco, and vaping devices, such as e-cigarettes. If you need help quitting, ask your health care provider. Do not use drugs. If you are sexually active, practice safe sex. Use a condom or other form of protection to prevent STIs. If you do not wish to become pregnant, use a form of birth control. If you plan to become pregnant, see your health care provider for a prepregnancy visit. Find healthy ways to manage stress, such as: Meditation, yoga, or listening to music. Journaling. Talking to a trusted person. Spending time with friends and family. Minimize exposure to UV radiation to reduce your risk of skin cancer. Safety Always wear your seat belt while driving or riding in a vehicle. Do not drive: If you have been drinking alcohol. Do not ride with someone who has been drinking. If you have been using any mind-altering substances or drugs. While texting. When you are tired or distracted. Wear a helmet and other protective equipment during sports activities. If you have firearms in your house, make sure you follow all gun safety procedures. Seek help if you have been physically or sexually abused. What's next? Go to your health care provider once a year for an annual wellness visit. Ask your health care provider how often you should have your eyes and teeth checked. Stay up to date on all vaccines. This information is not intended to replace advice given to you by your health care provider. Make sure you discuss any questions you have with your health care provider. Document Revised: 11/15/2020 Document Reviewed: 11/15/2020 Elsevier Patient Education  2024 [employer].

## 2022-12-20 NOTE — Progress Notes (Signed)
Subjective:    Patient ID: Kelly Houston, female    DOB: Mar 01, 1981, 42 y.o.   MRN: [id number]  HPI Here for wellness exam and fasting.  Pt works for [employer]. Pt exercises 5-6  times a week(walks, runs and weights). Pt states eats healthy. Non smoker. Social sporadic alcohol use.   Pt due for tdap. She will get that today.  Up to date on papsmear.   Pt has anxiety when she flies. About to travel to [place] and may be flying between countries over next 3 months. Will rx 10 tabs today.    Review of Systems  Constitutional:  Positive for fatigue. Negative for chills and fever.       Mild occasional  HENT:  Negative for congestion, drooling and ear pain.        When young pt was thought to have enlearged thyroid. Also some enlarged thyroid possible in the past. Today on exam mild enlarged. Also 20 years ago had blood work checked.  Respiratory:  Negative for cough, chest tightness, shortness of breath and wheezing.   Cardiovascular:  Negative for chest pain and palpitations.  Gastrointestinal:  Negative for abdominal pain.  Genitourinary:  Negative for dysuria, flank pain and frequency.  Musculoskeletal:  Negative for back pain.  Skin:  Negative for rash.  Neurological:  Negative for dizziness, facial asymmetry, speech difficulty, weakness and light-headedness.  Hematological:  Negative for adenopathy. Does not bruise/bleed easily.  Psychiatric/Behavioral:  Negative for behavioral problems, decreased concentration and hallucinations. The patient is nervous/anxious.        Only with flying.   Past Medical History:  Diagnosis Date   Allergy    Angio-edema    Anxiety    when she travels by air.   Migraines    since age 85   Urticaria    Vertigo    Per patient     Social History   Socioeconomic History   Marital status: Single    Spouse name: Not on file   Number of children: Not on file   Years of education: Not on file   Highest education level: Not on  file  Occupational History   Not on file  Tobacco Use   Smoking status: Never    Passive exposure: Never   Smokeless tobacco: Never  Vaping Use   Vaping status: Never Used  Substance and Sexual Activity   Alcohol use: Yes    Comment: socially   Drug use: Never   Sexual activity: Never    Birth control/protection: None  Other Topics Concern   Not on file  Social History Narrative   Not on file   Social Determinants of Health   Financial Resource Strain: Not on file  Food Insecurity: Not on file  Transportation Needs: Not on file  Physical Activity: Not on file  Stress: Not on file  Social Connections: Not on file  Intimate Partner Violence: Not on file    Past Surgical History:  Procedure Laterality Date   rt wrist cyst removal. ganglion.      Family History  Problem Relation Age of Onset   Hypertension Mother    High Cholesterol Father    Urticaria Sister    Angioedema Sister    Depression Brother    Thyroid disease Brother    Lupus Maternal Grandmother     Allergies  Allergen Reactions   Propranolol Hives and Rash    Current Outpatient Medications on File Prior to Visit  Medication Sig Dispense  Refill   ALPRAZolam (XANAX XR) 0.5 MG 24 hr tablet 1 tab po q day prn anxiety before flight 10 tablet 0   ALPRAZolam (XANAX XR) 0.5 MG 24 hr tablet 1 tab po prn anxiety prior to flying. 10 tablet 0   amLODipine (NORVASC) 5 MG tablet TAKE 1 TABLET (5 MG TOTAL) BY MOUTH DAILY. 90 tablet 3   cetirizine (ZYRTEC) 10 MG tablet TAKE 1 TABLET (10 MG TOTAL) BY MOUTH IN THE MORNING AND AT BEDTIME. 180 tablet 1   famotidine (PEPCID) 20 MG tablet Take 1 tablet (20 mg total) by mouth 2 (two) times daily. (Patient taking differently: Take 20 mg by mouth as needed.) 60 tablet 3   fluticasone (FLONASE) 50 MCG/ACT nasal spray Place into both nostrils as needed.     naproxen sodium (ALEVE) 220 MG tablet Take 220 mg by mouth as needed.     olopatadine (PATANOL) 0.1 % ophthalmic  solution Place 1 drop into both eyes daily as needed for allergies.     No current facility-administered medications on file prior to visit.    BP 126/62 (BP Location: Left Arm, Patient Position: Sitting, Cuff Size: Normal)   Pulse 100   Temp 98 F (36.7 C) (Oral)   Resp 16   Ht 5\' 4"  (1.626 m)   Wt 118 lb 9.6 oz (53.8 kg)   SpO2 100%   BMI 20.36 kg/m        Objective:   Physical Exam  General Mental Status- Alert. General Appearance- Not in acute distress.   Skin General: Color- Normal Color. Moisture- Normal Moisture.  Neck Carotid Arteries- Normal color. Moisture- Normal Moisture. No carotid bruits. No JVD.  Chest and Lung Exam Auscultation: Breath Sounds:-Normal.  Cardiovascular Auscultation:Rythm- Regular. Murmurs & Other Heart Sounds:Auscultation of the heart reveals- No Murmurs.  Abdomen Inspection:-Inspeection Normal. Palpation/Percussion:Note:No mass. Palpation and Percussion of the abdomen reveal- Non Tender, Non Distended + BS, no rebound or guarding.    Neurologic Cranial Nerve exam:- CN III-XII intact(No nystagmus), symmetric smile. Strength:- 5/5 equal and symmetric strength both upper and lower extremities.    Neck- on exam appears to have moderate thyromegaly.     Assessment & Plan:   For you wellness exam today I have ordered cbc, cmp and lipid panel.   Vaccine tdap today.   Recommend exercise and healthy diet.   We will let you know lab results as they come in.   Follow up date appointment will be determined after lab review.     Allergic rhinitis- controlled with med regimen let me know when you need refill.   Raynauds- continue amlodpine.   For anxiety with flying will refill your xanax as needed. Give 10 tab rx for flying anxiety. Presently since not using frequently not requiring controlled med contract and UDS.  Also for thyromegaly placed [place] order and for mild fatigue tsh and t4.   [place], [place]   [id number] charge  as did address thyromegaly and anxeity for flying. Rx xanax and ordred thyroid US.

## 2022-12-21 LAB — CBC WITH DIFFERENTIAL/PLATELET
Absolute Monocytes: 441 cells/uL (ref 200–950)
Basophils Absolute: 41 cells/uL (ref 0–200)
Basophils Relative: 0.7 %
Eosinophils Relative: 0.7 %
HCT: 41.1 % (ref 35.0–45.0)
Lymphs Abs: 1462 cells/uL (ref 850–3900)
MCH: 29.7 pg (ref 27.0–33.0)
MCHC: 33.6 g/dL (ref 32.0–36.0)
MPV: 9.6 fL (ref 7.5–12.5)
Monocytes Relative: 7.6 %
Neutro Abs: 3816 cells/uL (ref 1500–7800)
RBC: 4.64 10*6/uL (ref 3.80–5.10)
RDW: 12 % (ref 11.0–15.0)
WBC: 5.8 10*3/uL (ref 3.8–10.8)

## 2022-12-21 LAB — COMPREHENSIVE METABOLIC PANEL
AG Ratio: 1.8 (calc) (ref 1.0–2.5)
ALT: 15 U/L (ref 6–29)
AST: 20 U/L (ref 10–30)
Albumin: 4.8 g/dL (ref 3.6–5.1)
Alkaline phosphatase (APISO): 55 U/L (ref 31–125)
BUN: 11 mg/dL (ref 7–25)
CO2: 23 mmol/L (ref 20–32)
Calcium: 9.6 mg/dL (ref 8.6–10.2)
Chloride: 104 mmol/L (ref 98–110)
Creat: 0.84 mg/dL (ref 0.50–0.97)
Globulin: 2.6 g/dL (calc) (ref 1.9–3.7)
Glucose, Bld: 88 mg/dL (ref 65–99)
Potassium: 4.1 mmol/L (ref 3.5–5.3)
Sodium: 139 mmol/L (ref 135–146)
Total Bilirubin: 0.6 mg/dL (ref 0.2–1.2)
Total Protein: 7.4 g/dL (ref 6.1–8.1)

## 2022-12-21 LAB — LIPID PANEL
Cholesterol: 187 mg/dL (ref <200)
HDL: 72 mg/dL (ref >=50)
LDL Cholesterol (Calc): 101 mg/dL (calc) — ABNORMAL HIGH
Non-HDL Cholesterol (Calc): 115 mg/dL (calc) (ref <130)
Total CHOL/HDL Ratio: 2.6 (calc) (ref <5.0)
Triglycerides: 49 mg/dL (ref <150)

## 2022-12-21 LAB — T4, FREE: Free T4: 1.2 ng/dL (ref 0.8–1.8)

## 2022-12-21 LAB — TSH: TSH: 1.39 mIU/L

## 2022-12-23 ENCOUNTER — Ambulatory Visit (HOSPITAL_BASED_OUTPATIENT_CLINIC_OR_DEPARTMENT_OTHER)
Admission: RE | Admit: 2022-12-23 | Discharge: 2022-12-23 | Disposition: A | Discharge status: Home / Self Care | Payer: BC Managed Care – PPO | Source: Ambulatory Visit | Attending: Medical | Admitting: Medical

## 2022-12-23 DIAGNOSIS — R221 Localized swelling, mass and lump, neck: Secondary | ICD-10-CM | POA: Diagnosis not present

## 2022-12-23 DIAGNOSIS — E010 Iodine-deficiency related diffuse (endemic) goiter: Secondary | ICD-10-CM | POA: Diagnosis not present

## 2023-01-25 IMAGING — MG DIGITAL DIAGNOSTIC BILAT W/ TOMO W/ CAD
6 of 10 series · 6 of 30 positions shown · non-contrast
Comparison: Previous exam(s).

CLINICAL DATA: Patient presents with a palpable lump in the right
breast. History of cysts.

EXAM:
DIGITAL DIAGNOSTIC BILATERAL MAMMOGRAM WITH TOMOSYNTHESIS AND CAD;
ULTRASOUND RIGHT BREAST LIMITED
TECHNIQUE: Bilateral digital diagnostic mammography and breast tomosynthesis
was performed. The images were evaluated with computer-aided
detection.; Targeted ultrasound examination of the right breast was
performed

[L MLO synth-2D]
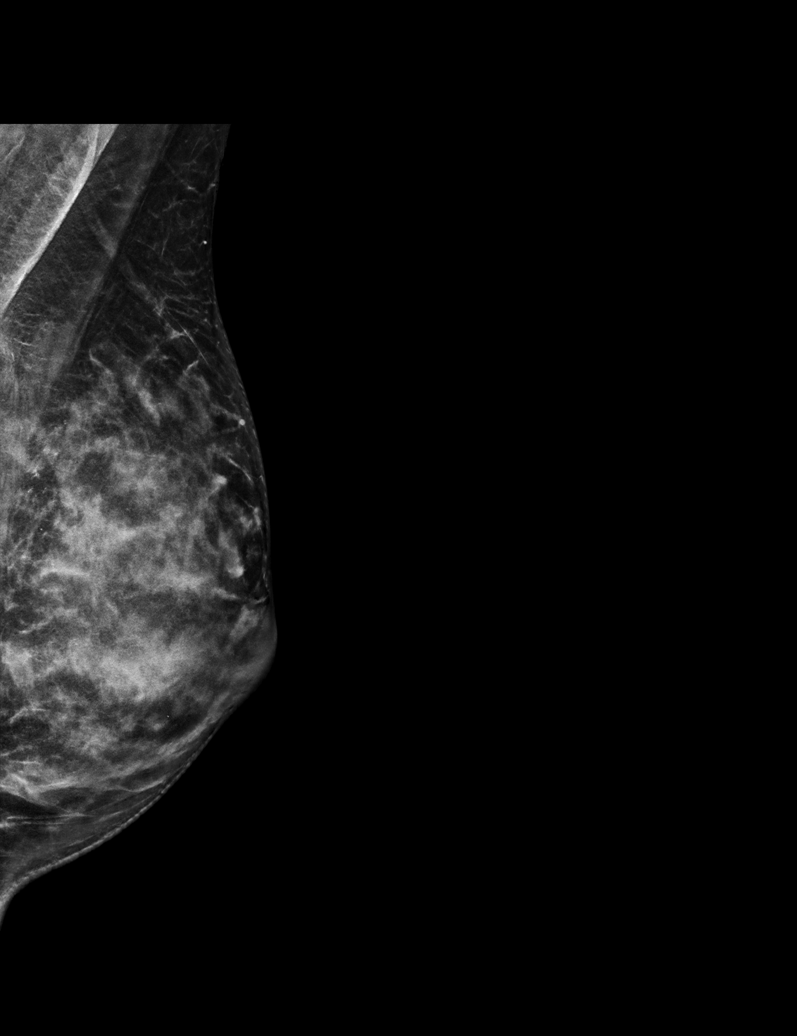

[R MLO synth-2D (1 of 2)]
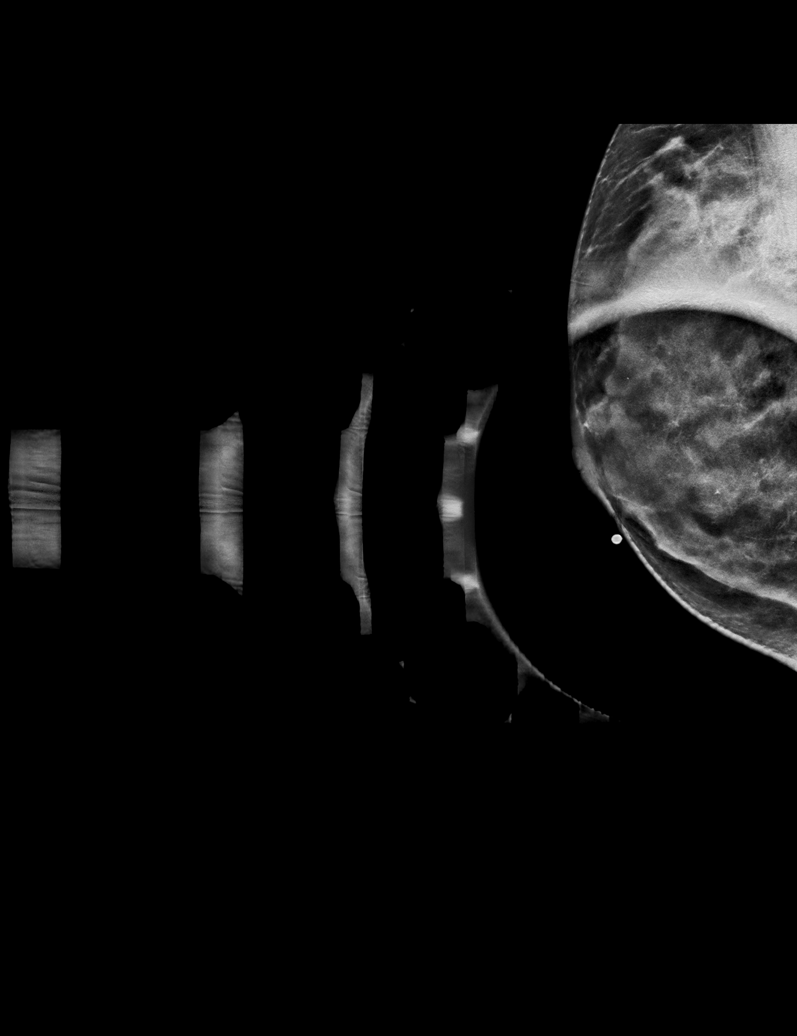

[R CC synth-2D]
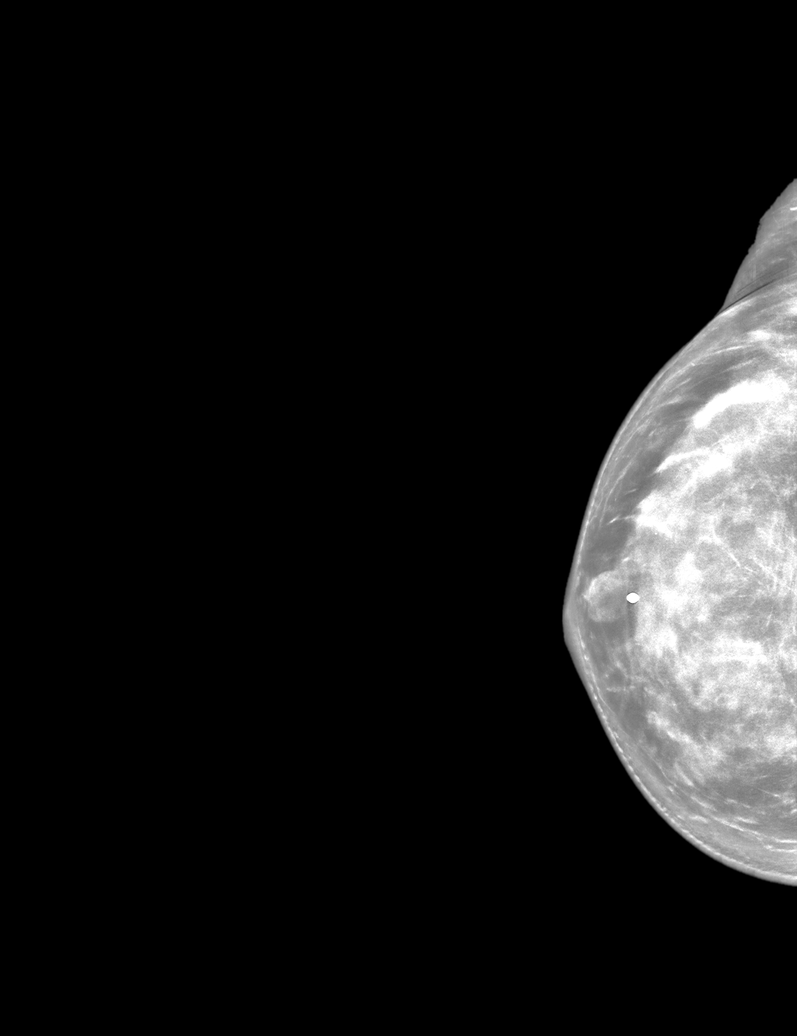

[L CC synth-2D]
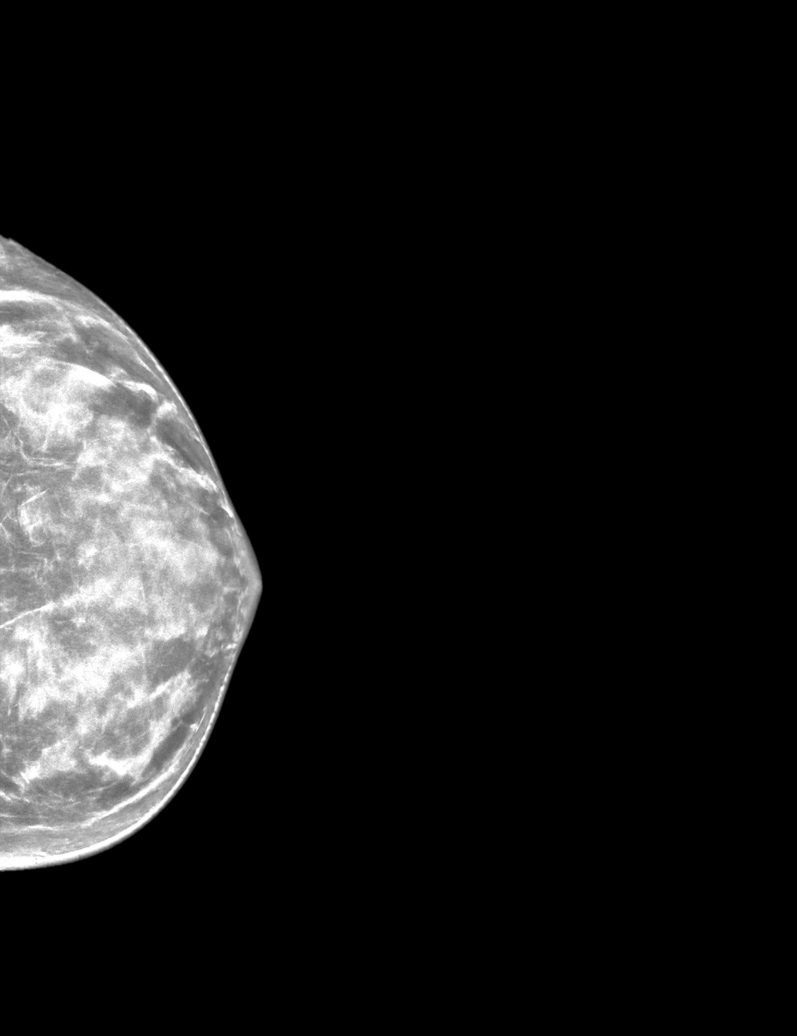

[R MLO synth-2D (2 of 2)]
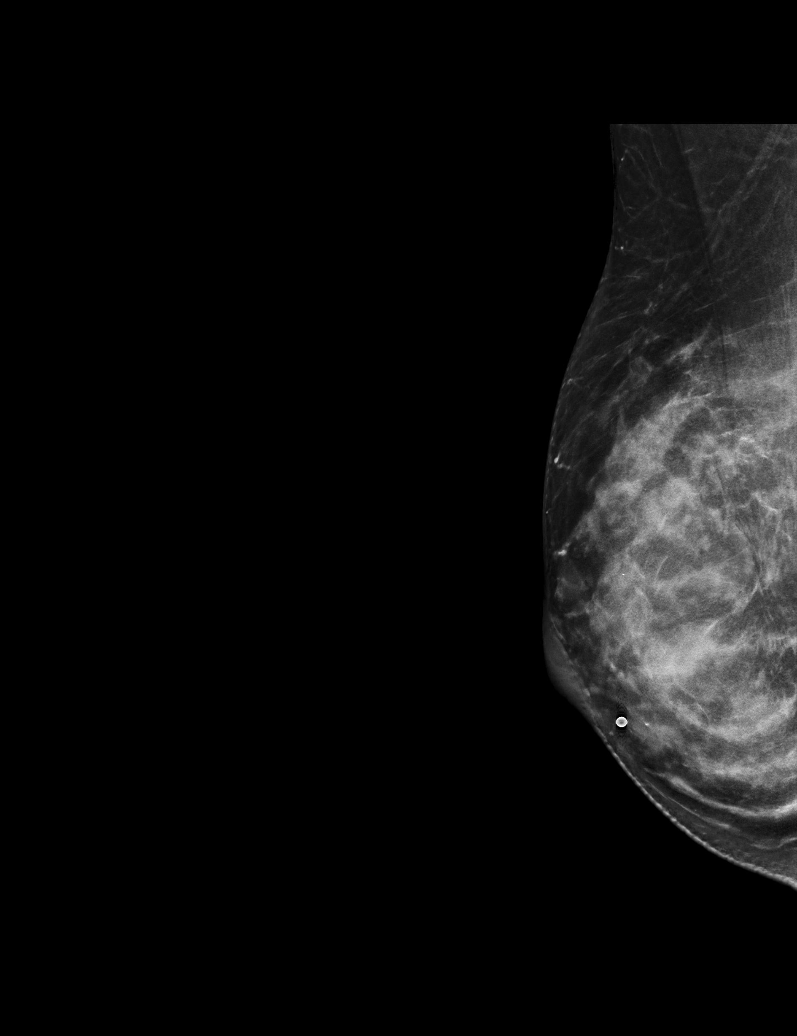

[L MLO tomo · tomo slice 30/59.0]
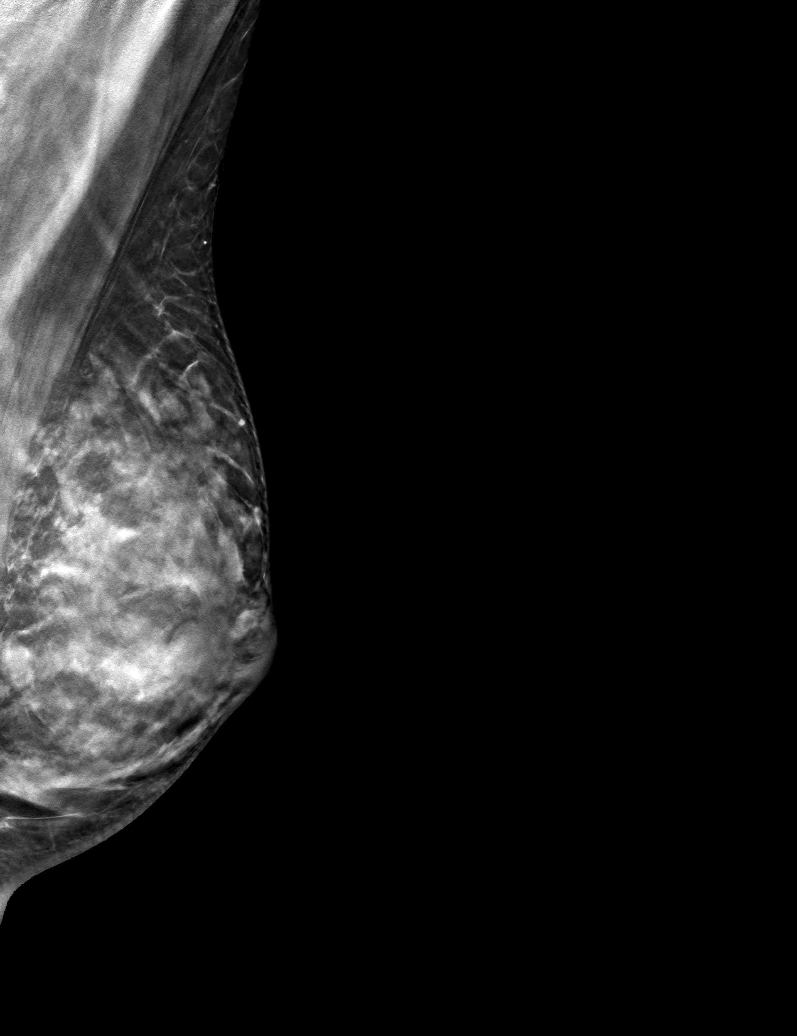

[6 of 30 positions shown; findings below may reference images not displayed]

ACR Breast Density Category d: The breast tissue is extremely dense,
which lowers the sensitivity of mammography.
FINDINGS: There are multiple bilateral partly obscured and part a
circumscribed masses consistent with multiple cysts similar to the
prior exams.

There are no areas of architectural distortion. No suspicious
calcifications.

On physical exam, firm smooth lump is palpated in the retroareolar
right breast.

Targeted ultrasound is performed, showing multiple cysts. At 7
o'clock, 1 cm from the nipple, there is a group of 3 cysts, largest
measuring 1.9 x 1.0 x 1.8 cm. These cysts correspond in location to
the palpable abnormality. There are no solid masses or suspicious
lesions.
IMPRESSION: 1. No evidence of breast malignancy.
2. Benign right breast cyst.

RECOMMENDATION:
Screening mammogram at age 42 unless there are persistent or
intervening clinical concerns. (Code:QM-E-RZQ)

I have discussed the findings and recommendations with the patient.
If applicable, a reminder letter will be sent to the patient
regarding the next appointment.

BI-RADS CATEGORY  2: Benign.

## 2023-07-01 NOTE — Progress Notes (Signed)
 Office Visit Note  Patient: Kelly Houston             Date of Birth: 01-Sep-1980           MRN: [id number]             PCP: Marisue Brooklyn Referring: Marisue Brooklyn Visit Date: 07/14/2023   Subjective:  Follow-up (Patient states she is having numbness in her right big toe. )    Discussed the use of AI scribe software for clinical note transcription with the patient, who gave verbal consent to proceed.  History of Present Illness   Kelly Houston is a 43 y.o. female here for follow up for Raynaud's symptoms associated with urticaria and positive ANA on amlodipine 5 mg daily.   She experiences worsening anxiety-related symptoms, particularly at work, which include episodes of cold sweats and her hands becoming extremely cold even in warm environments. These symptoms are exacerbated by anxiety and stress rather than cold temperatures alone. Covering herself does not alleviate the symptoms due to sweating. She has a history of Raynaud's phenomenon, which may contribute to these symptoms.  She has a history of migraines, which she manages with lifestyle modifications such as proper sleep and diet. She previously used propranolol but developed an allergic reaction characterized by rashes and swollen lips, leading to discontinuation. She has not taken migraine medications for many years and reports improvement over time.  She experiences occasional swelling in her hands, particularly noted during a recent trip to [place], where her finger became swollen and painful. The swelling subsided upon returning to colder weather. She also reports swelling in her feet and ankles at the end of the day, and her toe frequently feels numb or 'asleep', especially in the morning.  She has a history of eczema and has been treated with topical steroids for rashes on her neck. She reports a sensation of difficulty initiating swallowing, particularly when taking pills, but  denies any significant swallowing problems or regurgitation. She occasionally experiences reflux, especially if she eats close to bedtime, but manages this by timing her meals earlier in the evening.  She denies caffeine use and reports a low sugar intake. She exercises regularly, rowing for about 20 minutes daily, which she finds beneficial. She has a history of OCD and has previously been on high doses of medication for it, but found the side effects intolerable. She is considering the impact of anxiety on her daily life, particularly at work.       Previous HPI 07/15/2022 Kelly Houston is a 43 y.o. female here for follow up for Raynaud's symptoms associated with urticaria and positive ANA on amlodipine 5 mg daily.  Since her last visit she has not experienced any major problems or symptom exacerbation.  She has had Raynaud's again throughout the following wintertime.  She had a trip to [place] for work during which time her fingers are very cold but symptoms were reasonably controlled with use of thoroughly insulated mittens.  She did not notice any problem with lower extremity swelling.  Does continue having very dry skin which has not improved despite topical treatments. Still has migraines no particular increase or change. The hives and skin rashes are doing well without major exacerbation. She does notice pain and numbness affecting both hands at night and first thing in the morning. In the past this affected her more on the ulnar side of the hand but recently is causing pain at the thumb.  Previous HPI 02/26/22 Kelly Houston is a 43 y.o. female here for follow up for raynaud's symptoms on amlodipine 5 mg daily. She has positive ANA with very low positive Scl-70 Abs and symptoms of joint pains, urticarial rashes, positional tremor, and sometimes areas of numbness. Her symptoms have been doing fairly well and tolerating the amlodipine okay. She does see some peripheral edema  in lower extremities worst at the end of the day this will cause some pain relieved taking off shoes. Raynaud's symptoms are a bit worse after a trip to central and [place] and with the weather change. No new lesions, pitting, or chronic skin changes. Other symptoms remain overall stable.   Previous HPI 08/01/2021 Kelly Houston is a 43 y.o. female here for follow up for raynaud's symptoms after starting amlodipine 5 mg daily. She also continues having some generalized symptoms with positional tremor, urticarial rashes, and joint pains. She notices partial improvement in hand discoloration and numbness but continues to have very cold fingers and toes. She notices some small nodules on the left index finger mostly none at the tip of the finger. No leg swelling migraines or orthostatic symptoms after starting amlodipine.   Previous HPI 07/02/21 Kelly Houston is a 43 y.o. female here for follow up for positive ANA with joint pains, raynaud's symptoms, and urticarial rashes observing with stable symptoms about 6 months ago. She continues to have a positional tremor, worse since off propanolol which was causing rashes. Hands are ice cold all the time, better in bed worse during day worse if body gets cold. She has not developed any lesions or skin damage but it is frequent and severe enough to bother her a lot.    Previous HPI 11/13/20 Kelly Houston is a 43 y.o. female here for evaluation of positive ANA and joint pains. She has experienced some degree of symptoms for years. More recently she describes increases in numbness episodically affecting the left worse than right arms and in the left foot and great toe. She notices hand pain and stiffness increased with cold exposure. Sleeping on either side often provokes waking overnight or early morning with numbness down the left arm. She has some pain and tightness in the low back limiting hip range of movement. She has had  some previous autoimmune workup apparently, she reports what sounds like positive autoantibody tests in the past. She also reports a recent tick bite over the past weekend. She was hiking outdoors on Saturday and noticed a tick attached to her left flank Sunday. She did shower after outdoors exposure Saturday but does not recall any insect at that time. Currently small erythematous rash present without any associated pain or sensation.     Review of Systems  Constitutional:  Negative for fatigue.  HENT:  Positive for mouth sores and mouth dryness.   Eyes:  Positive for dryness.  Respiratory:  Negative for shortness of breath.   Cardiovascular:  Negative for chest pain and palpitations.  Gastrointestinal:  Negative for blood in stool, constipation and diarrhea.  Endocrine: Negative for increased urination.  Genitourinary:  Negative for involuntary urination.  Musculoskeletal:  Positive for joint pain, joint pain, joint swelling, myalgias and myalgias. Negative for gait problem, muscle weakness, morning stiffness and muscle tenderness.  Skin:  Positive for color change and sensitivity to sunlight. Negative for rash and hair loss.  Allergic/Immunologic: Negative for susceptible to infections.  Neurological:  Positive for dizziness and headaches.  Hematological:  Negative for swollen  glands.  Psychiatric/Behavioral:  Negative for depressed mood and sleep disturbance. The patient is nervous/anxious.     PMFS History:  Patient Active Problem List   Diagnosis Date Noted   Numbness of toes 07/14/2023   Bilateral hand numbness 07/15/2022   Seasonal and perennial allergic rhinoconjunctivitis 08/09/2021   Urticaria 01/08/2021   Raynaud's syndrome without gangrene 12/10/2020   Positive ANA (antinuclear antibody) 11/13/2020   Migraines 10/31/1989    Past Medical History:  Diagnosis Date   Allergy    Angio-edema    Anxiety    when she travels by air.   Eczema    Migraines    since age 62    Urticaria    Vertigo    Per patient    Family History  Problem Relation Age of Onset   Hypertension Mother    High Cholesterol Father    Urticaria Sister    Angioedema Sister    Depression Brother    Thyroid disease Brother    Lupus Maternal Grandmother    Past Surgical History:  Procedure Laterality Date   rt wrist cyst removal. ganglion.     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  Cardiovascular:     Rate and Rhythm: Normal rate and regular rhythm.  Pulmonary:     Effort: Pulmonary effort is normal.     Breath sounds: Normal breath sounds.  Musculoskeletal:     Right lower leg: No edema.     Left lower leg: No edema.  Lymphadenopathy:     Cervical: No cervical adenopathy.  Skin:    General: Skin is warm and dry.  Neurological:     Mental Status: She is alert.  Psychiatric:        Mood and Affect: Mood normal.      Musculoskeletal Exam:  Shoulders full ROM no tenderness or swelling Elbows full ROM no tenderness or swelling Wrists full ROM no tenderness or swelling Fingers full ROM no tenderness or swelling Knees full ROM no tenderness or swelling Ankles full ROM no tenderness or swelling  Investigation: No additional findings.  Imaging: No results found.  Recent Labs: Lab Results  Component Value Date   WBC 5.8 12/20/2022   HGB 13.8 12/20/2022   PLT 283 12/20/2022   NA 139 12/20/2022   K 4.1 12/20/2022   CL 104 12/20/2022   CO2 23 12/20/2022    GLUCOSE 88 12/20/2022   BUN 11 12/20/2022   CREATININE 0.84 12/20/2022   BILITOT 0.6 12/20/2022   ALKPHOS 45 12/17/2021   AST 20 12/20/2022   ALT 15 12/20/2022   PROT 7.4 12/20/2022   ALBUMIN 4.7 12/17/2021   CALCIUM 9.6 12/20/2022    Speciality Comments: No specialty comments available.  Procedures:  No procedures performed Allergies: Propranolol   Assessment / Plan:     Visit Diagnoses: Raynaud's syndrome without gangrene - 5 mg amlodipine - Plan: ANA, Anti-scleroderma antibody, amLODipine (NORVASC) 5 MG tablet  Experiencing cold hands and cold sweats, particularly during periods of anxiety and stress. Currently managed with amlodipine. Discussed potential switch to sildenafil, but concern for potential increase in migraines. -Consider rechecking ANA and SCL-70 antibody levels, given it has been over two years since last check and presence of Raynaud's phenomenon and potential esophageal motility issues -Continue current management with amlodipine 5 mg daily. -Monitor symptoms and consider alternative treatments if condition worsens.  Positive ANA (antinuclear antibody) - Plan: ANA, Anti-scleroderma antibody  Bilateral hand numbness  Numbness of toes Experiencing localized numbness in the big toe, potentially due to nerve impingement. -Monitor symptoms.  Anxiety Increased anxiety levels, particularly at work, contributing to physical discomfort and exacerbation of Raynaud's symptoms. Previous history of OCD managed with high-dose medication, but discontinued due to side effects. -Consider consultation with a specialist for management of anxiety. -Consider potential use of SSRIs or SNRIs.  Swallowing Difficulty Experiencing difficulty with initial swallow, particularly when taking pills. No severe reflux, unintentional weight loss, or regurgitation. -Consider barium swallow test to assess for potential esophageal motility issues.  Migraines Well-controlled currently with  lifestyle modifications.  Eczema Managed with topical steroids.    Orders: Orders Placed This Encounter  Procedures   ANA   Anti-scleroderma antibody   Anti-nuclear ab-titer (ANA titer)   Meds ordered this encounter  Medications   amLODipine (NORVASC) 5 MG tablet    Sig: Take 1 tablet (5 mg total) by mouth daily.    Dispense:  90 tablet    Refill:  3     Follow-Up Instructions: Return in about 1 year (around 07/13/2024) for Raynaud's on CCB.   Fuller Plan, MD  Note - This record has been created using [employer].  Chart creation errors have been sought, but may not always  have been located. Such creation errors do not reflect on  the standard of medical care.

## 2023-07-14 ENCOUNTER — Ambulatory Visit: Payer: BC Managed Care – PPO | Attending: Internal Medicine | Admitting: Internal Medicine

## 2023-07-14 ENCOUNTER — Encounter: Payer: Self-pay | Admitting: Internal Medicine

## 2023-07-14 VITALS — BP 132/78 | HR 79 | Resp 14 | Ht 64.0 in | Wt 121.0 lb

## 2023-07-14 DIAGNOSIS — I7300 Raynaud's syndrome without gangrene: Secondary | ICD-10-CM | POA: Diagnosis not present

## 2023-07-14 DIAGNOSIS — R200 Anesthesia of skin: Secondary | ICD-10-CM | POA: Diagnosis not present

## 2023-07-14 DIAGNOSIS — R768 Other specified abnormal immunological findings in serum: Secondary | ICD-10-CM | POA: Diagnosis not present

## 2023-07-14 MED ORDER — AMLODIPINE BESYLATE 5 MG PO TABS
5.0000 mg | ORAL_TABLET | Freq: Every day | ORAL | 3 refills | Status: AC
Start: 2023-07-14 — End: ?

## 2023-07-16 LAB — ANTI-SCLERODERMA ANTIBODY: Scleroderma (Scl-70) (ENA) Antibody, IgG: <1 AI

## 2023-07-16 LAB — ANA: Anti Nuclear Antibody (ANA): POSITIVE — AB

## 2023-07-16 LAB — ANTI-NUCLEAR AB-TITER (ANA TITER): ANA Titer 1: 1:40 {titer} — ABNORMAL HIGH

## 2023-08-28 ENCOUNTER — Other Ambulatory Visit: Payer: Self-pay | Admitting: Internal Medicine

## 2023-08-28 DIAGNOSIS — I7300 Raynaud's syndrome without gangrene: Secondary | ICD-10-CM

## 2023-12-22 ENCOUNTER — Encounter: Payer: BC Managed Care – PPO | Admitting: Medical

## 2023-12-29 ENCOUNTER — Ambulatory Visit (INDEPENDENT_AMBULATORY_CARE_PROVIDER_SITE_OTHER): Admitting: Medical

## 2023-12-29 VITALS — BP 120/70 | HR 76 | Resp 15 | Ht 64.0 in | Wt 120.4 lb

## 2023-12-29 DIAGNOSIS — Z0000 Encounter for general adult medical examination without abnormal findings: Secondary | ICD-10-CM | POA: Diagnosis not present

## 2023-12-29 DIAGNOSIS — Z1159 Encounter for screening for other viral diseases: Secondary | ICD-10-CM | POA: Diagnosis not present

## 2023-12-29 MED ORDER — ALPRAZOLAM ER 0.5 MG PO TB24: ORAL_TABLET | ORAL | 0 refills | Status: AC

## 2023-12-29 NOTE — Progress Notes (Signed)
 Subjective:    Patient ID: Kelly Houston, female    DOB: 11-03-1980, 43 y.o.   MRN: [id number]  HPI Here for wellness exam and fasting.   Pt works for [employer]. Pt exercises 5-6  times a week(walks, runs and weights). Pt states eats healthy. Non smoker. Social sporadic alcohol use.  Pt state she did get hep b vaccines.  Pt also got hpv vaccines as well. She thinks has copies of records as she did immigrate.  She will get hep c antibody test.   Pt up to date on papsmear.  Pt still has anxiety when she flies. She uses xanax  only when flying. 10 tab lasted her one year. Sometimes has to fly with work.    Review of Systems  Constitutional:  Negative for chills and fatigue.  HENT:  Negative for congestion and ear pain.   Respiratory:  Negative for cough, chest tightness and wheezing.   Cardiovascular:  Negative for chest pain and palpitations.  Gastrointestinal:  Negative for abdominal distention, anal bleeding, constipation and rectal pain.  Genitourinary:  Negative for dyspareunia and enuresis.  Musculoskeletal:  Negative for back pain and myalgias.  Skin:  Negative for rash.  Neurological:  Negative for dizziness, speech difficulty, weakness and light-headedness.  Hematological:  Negative for adenopathy. Does not bruise/bleed easily.  Psychiatric/Behavioral:  Negative for behavioral problems, dysphoric mood and suicidal ideas. The patient is nervous/anxious.        Flight anxiety.    Past Medical History:  Diagnosis Date   Allergy    Angio-edema    Anxiety    when she travels by air.   Eczema    Migraines    since age 11   Urticaria    Vertigo    Per patient     Social History   Socioeconomic History   Marital status: Single    Spouse name: Not on file   Number of children: Not on file   Years of education: Not on file   Highest education level: Doctorate  Occupational History   Not on file  Tobacco Use   Smoking status: Never    Passive exposure:  Never   Smokeless tobacco: Never  Vaping Use   Vaping status: Never Used  Substance and Sexual Activity   Alcohol use: Yes    Comment: socially   Drug use: Never   Sexual activity: Never    Birth control/protection: None  Other Topics Concern   Not on file  Social History Narrative   Not on file   Social Drivers of Health   Financial Resource Strain: Low Risk  (12/29/2023)   Overall Financial Resource Strain (CARDIA)    Difficulty of Paying Living Expenses: Not hard at all  Food Insecurity: No Food Insecurity (12/29/2023)   Hunger Vital Sign    Worried About Running Out of Food in the Last Year: Never true    Ran Out of Food in the Last Year: Never true  Transportation Needs: No Transportation Needs (12/29/2023)   PRAPARE - Administrator, Civil Service (Medical): No    Lack of Transportation (Non-Medical): No  Physical Activity: Sufficiently Active (12/29/2023)   Exercise Vital Sign    Days of Exercise per Week: 6 days    Minutes of Exercise per Session: 40 min  Stress: No Stress Concern Present (12/29/2023)   [employer] of Occupational Health - Occupational Stress Questionnaire    Feeling of Stress: Only a little  Social Connections: Socially Isolated (  12/29/2023)   Social Connection and Isolation Panel    Frequency of Communication with Friends and Family: More than three times a week    Frequency of Social Gatherings with Friends and Family: More than three times a week    Attends Religious Services: Never    Database administrator or Organizations: No    Attends Engineer, structural: Not on file    Marital Status: Never married  Intimate Partner Violence: Not on file    Past Surgical History:  Procedure Laterality Date   rt wrist cyst removal. ganglion.      Family History  Problem Relation Age of Onset   Hypertension Mother    High Cholesterol Father    Urticaria Sister    Angioedema Sister    Depression Brother    Thyroid  disease  Brother    Lupus Maternal Grandmother     Allergies  Allergen Reactions   Propranolol Hives and Rash    Current Outpatient Medications on File Prior to Visit  Medication Sig Dispense Refill   ALPRAZolam  (XANAX  XR) 0.5 MG 24 hr tablet 1 tab po q day prn anxiety before flight 10 tablet 0   amLODipine  (NORVASC ) 5 MG tablet Take 1 tablet (5 mg total) by mouth daily. 90 tablet 3   cetirizine  (ZYRTEC ) 10 MG tablet TAKE 1 TABLET (10 MG TOTAL) BY MOUTH IN THE MORNING AND AT BEDTIME. 180 tablet 1   famotidine  (PEPCID ) 20 MG tablet Take 1 tablet (20 mg total) by mouth 2 (two) times daily. (Patient taking differently: Take 20 mg by mouth as needed.) 60 tablet 3   fluticasone (FLONASE) 50 MCG/ACT nasal spray Place into both nostrils as needed.     naproxen sodium (ALEVE) 220 MG tablet Take 220 mg by mouth as needed.     olopatadine (PATANOL) 0.1 % ophthalmic solution Place 1 drop into both eyes daily as needed for allergies.     No current facility-administered medications on file prior to visit.    BP (!) 140/80   Pulse 76   Resp 15   Ht 5' 4 (1.626 m)   Wt 120 lb 6.4 oz (54.6 kg)   LMP 12/29/2023 (Exact Date)   SpO2 99%   BMI 20.67 kg/m        Objective:   Physical Exam  General Mental Status- Alert. General Appearance- Not in acute distress.   Skin General: Color- Normal Color. Moisture- Normal Moisture.  Neck  No JVD.  Chest and Lung Exam Auscultation: Breath Sounds:- CTA  Cardiovascular Auscultation:Rythm- RRR Murmurs & Other Heart Sounds:Auscultation of the heart reveals- No Murmurs.  Abdomen Inspection:-Inspeection Normal. Palpation/Percussion:Note:No mass. Palpation and Percussion of the abdomen reveal- Non Tender, Non Distended + BS, no rebound or guarding.   Neurologic Cranial Nerve exam:- CN III-XII intact(No nystagmus), symmetric smile. Strength:- 5/5 equal and symmetric strength both upper and lower extremities.   Lower ext- calfs not swollen. Negative  homans sign. No pedal edema.      Assessment & Plan:   Patient Instructions  For you wellness exam today I have ordered cbc, cmp, hep c antibody and  lipid panel.  Up to date on vaccines. Please send hep b and hpv vaccine records by my chart.  Recommend exercise and healthy diet.  We will let you know lab results as they come in.  Follow up date appointment will be determined after lab review.    For anxiety flying refilling your xanax .     [place]  Vernella Niznik, PA-C

## 2023-12-29 NOTE — Patient Instructions (Addendum)
 For you wellness exam today I have ordered cbc, cmp, hep c antibody and  lipid panel.  Up to date on vaccines. Please send hep b and hpv vaccine records by my chart.  Recommend exercise and healthy diet.  We will let you know lab results as they come in.  Follow up date appointment will be determined after lab review.    For anxiety flying refilling your xanax .   Preventive Care 58-41 Years Old, Female Preventive care refers to lifestyle choices and visits with your health care provider that can promote health and wellness. Preventive care visits are also called wellness exams. What can I expect for my preventive care visit? Counseling Your health care provider may ask you questions about your: Medical history, including: Past medical problems. Family medical history. Pregnancy history. Current health, including: Menstrual cycle. Method of birth control. Emotional well-being. Home life and relationship well-being. Sexual activity and sexual health. Lifestyle, including: Alcohol, nicotine or tobacco, and drug use. Access to firearms. Diet, exercise, and sleep habits. Work and work Astronomer. Sunscreen use. Safety issues such as seatbelt and bike helmet use. Physical exam Your health care provider will check your: Height and weight. These may be used to calculate your BMI (body mass index). BMI is a measurement that tells if you are at a healthy weight. Waist circumference. This measures the distance around your waistline. This measurement also tells if you are at a healthy weight and may help predict your risk of certain diseases, such as type 2 diabetes and high blood pressure. Heart rate and blood pressure. Body temperature. Skin for abnormal spots. What immunizations do I need?  Vaccines are usually given at various ages, according to a schedule. Your health care provider will recommend vaccines for you based on your age, medical history, and lifestyle or other  factors, such as travel or where you work. What tests do I need? Screening Your health care provider may recommend screening tests for certain conditions. This may include: Lipid and cholesterol levels. Diabetes screening. This is done by checking your blood sugar (glucose) after you have not eaten for a while (fasting). Pelvic exam and Pap test. Hepatitis B test. Hepatitis C test. HIV (human immunodeficiency virus) test. STI (sexually transmitted infection) testing, if you are at risk. Lung cancer screening. Colorectal cancer screening. Mammogram. Talk with your health care provider about when you should start having regular mammograms. This may depend on whether you have a family history of breast cancer. BRCA-related cancer screening. This may be done if you have a family history of breast, ovarian, tubal, or peritoneal cancers. Bone density scan. This is done to screen for osteoporosis. Talk with your health care provider about your test results, treatment options, and if necessary, the need for more tests. Follow these instructions at home: Eating and drinking  Eat a diet that includes fresh fruits and vegetables, whole grains, lean protein, and low-fat dairy products. Take vitamin and mineral supplements as recommended by your health care provider. Do not drink alcohol if: Your health care provider tells you not to drink. You are pregnant, may be pregnant, or are planning to become pregnant. If you drink alcohol: Limit how much you have to 0-1 drink a day. Know how much alcohol is in your drink. In the [place] one drink equals one 12 oz bottle of beer (355 mL), one 5 oz glass of wine (148 mL), or one 1 oz glass of hard liquor (44 mL). Lifestyle Brush your teeth every morning and  night with fluoride toothpaste. Floss one time each day. Exercise for at least 30 minutes 5 or more days each week. Do not use any products that contain nicotine or tobacco. These products include  cigarettes, chewing tobacco, and vaping devices, such as e-cigarettes. If you need help quitting, ask your health care provider. Do not use drugs. If you are sexually active, practice safe sex. Use a condom or other form of protection to prevent STIs. If you do not wish to become pregnant, use a form of birth control. If you plan to become pregnant, see your health care provider for a prepregnancy visit. Take aspirin only as told by your health care provider. Make sure that you understand how much to take and what form to take. Work with your health care provider to find out whether it is safe and beneficial for you to take aspirin daily. Find healthy ways to manage stress, such as: Meditation, yoga, or listening to music. Journaling. Talking to a trusted person. Spending time with friends and family. Minimize exposure to UV radiation to reduce your risk of skin cancer. Safety Always wear your seat belt while driving or riding in a vehicle. Do not drive: If you have been drinking alcohol. Do not ride with someone who has been drinking. When you are tired or distracted. While texting. If you have been using any mind-altering substances or drugs. Wear a helmet and other protective equipment during sports activities. If you have firearms in your house, make sure you follow all gun safety procedures. Seek help if you have been physically or sexually abused. What's next? Visit your health care provider once a year for an annual wellness visit. Ask your health care provider how often you should have your eyes and teeth checked. Stay up to date on all vaccines. This information is not intended to replace advice given to you by your health care provider. Make sure you discuss any questions you have with your health care provider. Document Revised: 11/15/2020 Document Reviewed: 11/15/2020 Elsevier Patient Education  2024 [employer].

## 2023-12-30 LAB — COMPREHENSIVE METABOLIC PANEL WITH GFR
ALT: 13 U/L (ref 0–35)
AST: 20 U/L (ref 0–37)
Albumin: 4.8 g/dL (ref 3.5–5.2)
Alkaline Phosphatase: 47 U/L (ref 39–117)
BUN: 12 mg/dL (ref 6–23)
CO2: 27 meq/L (ref 19–32)
Calcium: 9.4 mg/dL (ref 8.4–10.5)
Chloride: 101 meq/L (ref 96–112)
Creatinine, Ser: 0.87 mg/dL (ref 0.40–1.20)
GFR: 83.17 mL/min (ref >60.00)
Glucose, Bld: 98 mg/dL (ref 70–99)
Potassium: 4 meq/L (ref 3.5–5.1)
Sodium: 138 meq/L (ref 135–145)
Total Bilirubin: 0.5 mg/dL (ref 0.2–1.2)
Total Protein: 7.6 g/dL (ref 6.0–8.3)

## 2023-12-30 LAB — LIPID PANEL
Cholesterol: 190 mg/dL (ref 0–200)
HDL: 67.6 mg/dL (ref >39.00)
LDL Cholesterol: 110 mg/dL — ABNORMAL HIGH (ref 0–99)
NonHDL: 122.72
Total CHOL/HDL Ratio: 3
Triglycerides: 63 mg/dL (ref 0.0–149.0)
VLDL: 12.6 mg/dL (ref 0.0–40.0)

## 2023-12-30 LAB — CBC WITH DIFFERENTIAL/PLATELET
Basophils Absolute: 0.1 K/uL (ref 0.0–0.1)
Basophils Relative: 1.4 % (ref 0.0–3.0)
Eosinophils Absolute: 0 K/uL (ref 0.0–0.7)
Eosinophils Relative: 0.7 % (ref 0.0–5.0)
HCT: 41.8 % (ref 36.0–46.0)
Hemoglobin: 13.8 g/dL (ref 12.0–15.0)
Lymphocytes Relative: 20.2 % (ref 12.0–46.0)
Lymphs Abs: 1.2 K/uL (ref 0.7–4.0)
MCHC: 33.1 g/dL (ref 30.0–36.0)
MCV: 89.6 fl (ref 78.0–100.0)
Monocytes Absolute: 0.5 K/uL (ref 0.1–1.0)
Monocytes Relative: 8.4 % (ref 3.0–12.0)
Neutro Abs: 4.3 K/uL (ref 1.4–7.7)
Neutrophils Relative %: 69.3 % (ref 43.0–77.0)
Platelets: 275 K/uL (ref 150.0–400.0)
RBC: 4.66 Mil/uL (ref 3.87–5.11)
RDW: 13.3 % (ref 11.5–15.5)
WBC: 6.2 K/uL (ref 4.0–10.5)

## 2023-12-30 LAB — HEPATITIS C ANTIBODY: Hepatitis C Ab: NONREACTIVE

## 2023-12-31 ENCOUNTER — Encounter: Payer: Self-pay | Admitting: Medical

## 2023-12-31 ENCOUNTER — Ambulatory Visit: Payer: Self-pay | Admitting: Medical

## 2024-03-25 ENCOUNTER — Other Ambulatory Visit: Payer: Self-pay | Admitting: Medical

## 2024-03-25 ENCOUNTER — Telehealth (HOSPITAL_BASED_OUTPATIENT_CLINIC_OR_DEPARTMENT_OTHER): Payer: Self-pay

## 2024-03-25 DIAGNOSIS — Z1231 Encounter for screening mammogram for malignant neoplasm of breast: Secondary | ICD-10-CM

## 2024-03-30 ENCOUNTER — Ambulatory Visit
Admission: RE | Admit: 2024-03-30 | Discharge: 2024-03-30 | Disposition: A | Discharge status: Home / Self Care | Source: Ambulatory Visit

## 2024-03-30 DIAGNOSIS — Z1231 Encounter for screening mammogram for malignant neoplasm of breast: Secondary | ICD-10-CM

## 2024-04-03 ENCOUNTER — Ambulatory Visit: Payer: Self-pay | Admitting: Medical

## 2024-04-05 ENCOUNTER — Other Ambulatory Visit: Payer: Self-pay | Admitting: Medical

## 2024-04-05 DIAGNOSIS — R928 Other abnormal and inconclusive findings on diagnostic imaging of breast: Secondary | ICD-10-CM

## 2024-04-14 ENCOUNTER — Other Ambulatory Visit: Payer: Self-pay | Admitting: Medical

## 2024-04-14 ENCOUNTER — Ambulatory Visit
Admission: RE | Admit: 2024-04-14 | Discharge: 2024-04-14 | Disposition: A | Discharge status: Home / Self Care | Source: Ambulatory Visit | Attending: Medical | Admitting: Medical

## 2024-04-14 DIAGNOSIS — N6011 Diffuse cystic mastopathy of right breast: Secondary | ICD-10-CM | POA: Diagnosis not present

## 2024-04-14 DIAGNOSIS — N6009 Solitary cyst of unspecified breast: Secondary | ICD-10-CM | POA: Diagnosis not present

## 2024-04-14 DIAGNOSIS — R928 Other abnormal and inconclusive findings on diagnostic imaging of breast: Secondary | ICD-10-CM

## 2024-04-15 ENCOUNTER — Ambulatory Visit: Payer: Self-pay | Admitting: Medical

## 2024-06-30 NOTE — Assessment & Plan Note (Signed)
 SABRA

## 2024-06-30 NOTE — Assessment & Plan Note (Signed)
 Kelly Houston

## 2024-06-30 NOTE — Progress Notes (Unsigned)
 "  Office Visit Note  Patient: Kelly Houston             Date of Birth: [birth year]           MRN: [id number]             PCP: Dorina Dallas RIGGERS Referring: Dorina Dallas RIGGERS Visit Date: 07/13/2024   Subjective:  No chief complaint on file.   History of Present Illness: Kelly Houston is a 44 y.o. female here for follow up for Raynaud's symptoms associated with urticaria and positive ANA on amlodipine  5 mg daily.    Previous HPI 07/14/2023 Kelly Houston is a 44 y.o. female here for follow up for Raynaud's symptoms associated with urticaria and positive ANA on amlodipine  5 mg daily.    She experiences worsening anxiety-related symptoms, particularly at work, which include episodes of cold sweats and her hands becoming extremely cold even in warm environments. These symptoms are exacerbated by anxiety and stress rather than cold temperatures alone. Covering herself does not alleviate the symptoms due to sweating. She has a history of Raynaud's phenomenon, which may contribute to these symptoms.   She has a history of migraines, which she manages with lifestyle modifications such as proper sleep and diet. She previously used propranolol but developed an allergic reaction characterized by rashes and swollen lips, leading to discontinuation. She has not taken migraine medications for many years and reports improvement over time.   She experiences occasional swelling in her hands, particularly noted during a recent trip to [place] , where her finger became swollen and painful. The swelling subsided upon returning to colder weather. She also reports swelling in her feet and ankles at the end of the day, and her toe frequently feels numb or 'asleep', especially in the morning.   She has a history of eczema and has been treated with topical steroids for rashes on her neck. She reports a sensation of difficulty initiating swallowing, particularly when taking  pills, but denies any significant swallowing problems or regurgitation. She occasionally experiences reflux, especially if she eats close to bedtime, but manages this by timing her meals earlier in the evening.   She denies caffeine use and reports a low sugar intake. She exercises regularly, rowing for about 20 minutes daily, which she finds beneficial. She has a history of OCD and has previously been on high doses of medication for it, but found the side effects intolerable. She is considering the impact of anxiety on her daily life, particularly at work.         Previous HPI 07/15/2022 Kelly Houston is a 44 y.o. female here for follow up for Raynaud's symptoms associated with urticaria and positive ANA on amlodipine  5 mg daily.  Since her last visit she has not experienced any major problems or symptom exacerbation.  She has had Raynaud's again throughout the following wintertime.  She had a trip to [place] for work during which time her fingers are very cold but symptoms were reasonably controlled with use of thoroughly insulated mittens.  She did not notice any problem with lower extremity swelling.  Does continue having very dry skin which has not improved despite topical treatments. Still has migraines no particular increase or change. The hives and skin rashes are doing well without major exacerbation. She does notice pain and numbness affecting both hands at night and first thing in the morning. In the past this affected her more on the ulnar side of the hand but recently  is causing pain at the thumb.   Previous HPI 02/26/22 Kelly Houston is a 44 y.o. female here for follow up for raynaud's symptoms on amlodipine  5 mg daily. She has positive ANA with very low positive Scl-70 Abs and symptoms of joint pains, urticarial rashes, positional tremor, and sometimes areas of numbness. Her symptoms have been doing fairly well and tolerating the amlodipine  okay. She does see some  peripheral edema in lower extremities worst at the end of the day this will cause some pain relieved taking off shoes. Raynaud's symptoms are a bit worse after a trip [place] and with the weather change. No new lesions, pitting, or chronic skin changes. Other symptoms remain overall stable.   Previous HPI 08/01/2021 Kelly Houston is a 44 y.o. female here for follow up for raynaud's symptoms after starting amlodipine  5 mg daily. She also continues having some generalized symptoms with positional tremor, urticarial rashes, and joint pains. She notices partial improvement in hand discoloration and numbness but continues to have very cold fingers and toes. She notices some small nodules on the left index finger mostly none at the tip of the finger. No leg swelling migraines or orthostatic symptoms after starting amlodipine .   Previous HPI 07/02/21 Kelly Houston is a 44 y.o. female here for follow up for positive ANA with joint pains, raynaud's symptoms, and urticarial rashes observing with stable symptoms about 6 months ago. She continues to have a positional tremor, worse since off propanolol which was causing rashes. Hands are ice cold all the time, better in bed worse during day worse if body gets cold. She has not developed any lesions or skin damage but it is frequent and severe enough to bother her a lot.    Previous HPI 11/13/20 Kelly Houston is a 44 y.o. female here for evaluation of positive ANA and joint pains. She has experienced some degree of symptoms for years. More recently she describes increases in numbness episodically affecting the left worse than right arms and in the left foot and great toe. She notices hand pain and stiffness increased with cold exposure. Sleeping on either side often provokes waking overnight or early morning with numbness down the left arm. She has some pain and tightness in the low back limiting hip range of  movement. She has had some previous autoimmune workup apparently, she reports what sounds like positive autoantibody tests in the past. She also reports a recent tick bite over the past weekend. She was hiking outdoors on Saturday and noticed a tick attached to her left flank Sunday. She did shower after outdoors exposure Saturday but does not recall any insect at that time. Currently small erythematous rash present without any associated pain or sensation.   No Rheumatology ROS completed.   PMFS History:  Patient Active Problem List   Diagnosis Date Noted   Numbness of toes 07/14/2023   Bilateral hand numbness 07/15/2022   Seasonal and perennial allergic rhinoconjunctivitis 08/09/2021   Urticaria 01/08/2021   Raynaud's syndrome without gangrene 12/10/2020   Positive ANA (antinuclear antibody) 11/13/2020   Migraines 10/31/1989    Past Medical History:  Diagnosis Date   Allergy    Angio-edema    Anxiety    when she travels by air.   Eczema    Migraines    since age 63   Urticaria    Vertigo    Per patient    Family History  Problem Relation Age of Onset  Hypertension Mother    High Cholesterol Father    Urticaria Sister    Angioedema Sister    Depression Brother    Thyroid  disease Brother    Lupus Maternal Grandmother    Past Surgical History:  Procedure Laterality Date   rt wrist cyst removal. ganglion.     Social History   Social History Narrative   Not on file   Immunization History  Administered Date(s) Administered   Influenza-Unspecified 03/25/2022   Janssen (J&J) SARS-COV-2 Vaccination 09/07/2019   PFIZER SARS-COV-2 Pediatric Vaccination 5-67yrs 03/26/2020   Tdap 08/04/2012, 12/20/2022     Objective: Vital Signs: There were no vitals taken for this visit.   Physical Exam   Musculoskeletal Exam: ***   Investigation: No additional findings.  Imaging: No results found.  Recent Labs: Lab Results  Component Value Date   WBC 6.2 12/29/2023    HGB 13.8 12/29/2023   PLT 275.0 12/29/2023   NA 138 12/29/2023   K 4.0 12/29/2023   CL 101 12/29/2023   CO2 27 12/29/2023   GLUCOSE 98 12/29/2023   BUN 12 12/29/2023   CREATININE 0.87 12/29/2023   BILITOT 0.5 12/29/2023   ALKPHOS 47 12/29/2023   AST 20 12/29/2023   ALT 13 12/29/2023   PROT 7.6 12/29/2023   ALBUMIN 4.8 12/29/2023   CALCIUM 9.4 12/29/2023    Speciality Comments: No specialty comments available.  Procedures:  No procedures performed Allergies: Propranolol   Assessment / Plan:     Visit Diagnoses:  Assessment & Plan Raynaud's syndrome without gangrene     Positive ANA (antinuclear antibody)     Bilateral hand numbness     Numbness of toes      ***  Follow-Up Instructions: No follow-ups on file.   Lucia Harm M Daliah Chaudoin, CMA  Note - This record has been created using Animal nutritionist.  Chart creation errors have been sought, but may not always  have been located. Such creation errors do not reflect on  the standard of medical care. "

## 2024-07-13 ENCOUNTER — Ambulatory Visit: Payer: BC Managed Care – PPO | Admitting: Internal Medicine

## 2024-07-13 DIAGNOSIS — R200 Anesthesia of skin: Secondary | ICD-10-CM

## 2024-07-13 DIAGNOSIS — I7300 Raynaud's syndrome without gangrene: Secondary | ICD-10-CM

## 2024-07-13 DIAGNOSIS — R7689 Other specified abnormal immunological findings in serum: Secondary | ICD-10-CM

## 2024-10-12 ENCOUNTER — Other Ambulatory Visit

## 2025-01-03 ENCOUNTER — Encounter: Admitting: Medical
